# Patient Record
Sex: Female | Born: 1985
Health system: Southern US, Community
[De-identification: ages and names within clinical notes are randomized; demographics above are authoritative.]

## PROBLEM LIST (undated history)

## (undated) DIAGNOSIS — F329 Major depressive disorder, single episode, unspecified: Secondary | ICD-10-CM

## (undated) DIAGNOSIS — F419 Anxiety disorder, unspecified: Secondary | ICD-10-CM

## (undated) DIAGNOSIS — E669 Obesity, unspecified: Secondary | ICD-10-CM

## (undated) DIAGNOSIS — K589 Irritable bowel syndrome without diarrhea: Secondary | ICD-10-CM

## (undated) DIAGNOSIS — K219 Gastro-esophageal reflux disease without esophagitis: Secondary | ICD-10-CM

## (undated) DIAGNOSIS — I10 Essential (primary) hypertension: Secondary | ICD-10-CM

## (undated) DIAGNOSIS — T7840XA Allergy, unspecified, initial encounter: Secondary | ICD-10-CM

## (undated) DIAGNOSIS — K200 Eosinophilic esophagitis: Secondary | ICD-10-CM

## (undated) DIAGNOSIS — F32A Depression, unspecified: Secondary | ICD-10-CM

## (undated) HISTORY — PX: CHOLECYSTECTOMY: SHX55

## (undated) HISTORY — DX: Essential (primary) hypertension: I10

## (undated) HISTORY — DX: Depression, unspecified: F32.A

## (undated) HISTORY — DX: Gastro-esophageal reflux disease without esophagitis: K21.9

## (undated) HISTORY — DX: Obesity, unspecified: E66.9

## (undated) HISTORY — DX: Irritable bowel syndrome, unspecified: K58.9

## (undated) HISTORY — PX: TONSILLECTOMY: SUR1361

## (undated) HISTORY — PX: ESOPHAGEAL DILATION: SHX303

## (undated) HISTORY — DX: Anxiety disorder, unspecified: F41.9

## (undated) HISTORY — DX: Allergy, unspecified, initial encounter: T78.40XA

## (undated) HISTORY — DX: Major depressive disorder, single episode, unspecified: F32.9

## (undated) HISTORY — DX: Eosinophilic esophagitis: K20.0

---

## 2017-09-03 DIAGNOSIS — L03011 Cellulitis of right finger: Secondary | ICD-10-CM | POA: Diagnosis not present

## 2018-02-01 ENCOUNTER — Encounter: Payer: Self-pay | Admitting: Physician Assistant

## 2018-02-01 ENCOUNTER — Ambulatory Visit (INDEPENDENT_AMBULATORY_CARE_PROVIDER_SITE_OTHER): Payer: BLUE CROSS/BLUE SHIELD | Admitting: Physician Assistant

## 2018-02-01 VITALS — BP 164/111 | HR 93 | Resp 14 | Ht 64.0 in | Wt 390.0 lb

## 2018-02-01 DIAGNOSIS — F332 Major depressive disorder, recurrent severe without psychotic features: Secondary | ICD-10-CM | POA: Diagnosis not present

## 2018-02-01 DIAGNOSIS — M545 Low back pain, unspecified: Secondary | ICD-10-CM | POA: Insufficient documentation

## 2018-02-01 DIAGNOSIS — K200 Eosinophilic esophagitis: Secondary | ICD-10-CM | POA: Insufficient documentation

## 2018-02-01 DIAGNOSIS — R0609 Other forms of dyspnea: Secondary | ICD-10-CM | POA: Diagnosis not present

## 2018-02-01 DIAGNOSIS — E1165 Type 2 diabetes mellitus with hyperglycemia: Secondary | ICD-10-CM

## 2018-02-01 DIAGNOSIS — K589 Irritable bowel syndrome without diarrhea: Secondary | ICD-10-CM | POA: Insufficient documentation

## 2018-02-01 DIAGNOSIS — Z13220 Encounter for screening for lipoid disorders: Secondary | ICD-10-CM

## 2018-02-01 DIAGNOSIS — R0600 Dyspnea, unspecified: Secondary | ICD-10-CM

## 2018-02-01 DIAGNOSIS — Z131 Encounter for screening for diabetes mellitus: Secondary | ICD-10-CM

## 2018-02-01 DIAGNOSIS — Z6841 Body Mass Index (BMI) 40.0 and over, adult: Secondary | ICD-10-CM

## 2018-02-01 DIAGNOSIS — I10 Essential (primary) hypertension: Secondary | ICD-10-CM | POA: Insufficient documentation

## 2018-02-01 DIAGNOSIS — Z7689 Persons encountering health services in other specified circumstances: Secondary | ICD-10-CM

## 2018-02-01 DIAGNOSIS — Z1329 Encounter for screening for other suspected endocrine disorder: Secondary | ICD-10-CM

## 2018-02-01 DIAGNOSIS — F411 Generalized anxiety disorder: Secondary | ICD-10-CM

## 2018-02-01 DIAGNOSIS — R002 Palpitations: Secondary | ICD-10-CM | POA: Insufficient documentation

## 2018-02-01 DIAGNOSIS — Z130 Encounter for screening for diseases of the blood and blood-forming organs and certain disorders involving the immune mechanism: Secondary | ICD-10-CM

## 2018-02-01 MED ORDER — BUPROPION HCL ER (XL) 150 MG PO TB24: 150.0000 mg | ORAL_TABLET | ORAL | 0 refills | Status: DC

## 2018-02-01 MED ORDER — NAPROXEN 375 MG PO TABS: 375.0000 mg | ORAL_TABLET | Freq: Two times a day (BID) | ORAL | 0 refills | Status: DC | PRN

## 2018-02-01 MED ORDER — VALSARTAN-HYDROCHLOROTHIAZIDE 160-25 MG PO TABS: 1.0000 | ORAL_TABLET | Freq: Every day | ORAL | 0 refills | Status: DC

## 2018-02-01 NOTE — Patient Instructions (Signed)
Managing Your Hypertension Hypertension is commonly called high blood pressure. This is when the force of your blood pressing against the walls of your arteries is too strong. Arteries are blood vessels that carry blood from your heart throughout your body. Hypertension forces the heart to work harder to pump blood, and may cause the arteries to become narrow or stiff. Having untreated or uncontrolled hypertension can cause heart attack, stroke, kidney disease, and other problems. What are blood pressure readings? A blood pressure reading consists of a higher number over a lower number. Ideally, your blood pressure should be below 120/80. The first ("top") number is called the systolic pressure. It is a measure of the pressure in your arteries as your heart beats. The second ("bottom") number is called the diastolic pressure. It is a measure of the pressure in your arteries as the heart relaxes. What does my blood pressure reading mean? Blood pressure is classified into four stages. Based on your blood pressure reading, your health care provider may use the following stages to determine what type of treatment you need, if any. Systolic pressure and diastolic pressure are measured in a unit called mm Hg. Normal  Systolic pressure: below 120.  Diastolic pressure: below 80. Elevated  Systolic pressure: 120-129.  Diastolic pressure: below 80. Hypertension stage 1  Systolic pressure: 130-139.  Diastolic pressure: 80-89. Hypertension stage 2  Systolic pressure: 140 or above.  Diastolic pressure: 90 or above. What health risks are associated with hypertension? Managing your hypertension is an important responsibility. Uncontrolled hypertension can lead to:  A heart attack.  A stroke.  A weakened blood vessel (aneurysm).  Heart failure.  Kidney damage.  Eye damage.  Metabolic syndrome.  Memory and concentration problems.  What changes can I make to manage my  hypertension? Hypertension can be managed by making lifestyle changes and possibly by taking medicines. Your health care provider will help you make a plan to bring your blood pressure within a normal range. Eating and drinking  Eat a diet that is high in fiber and potassium, and low in salt (sodium), added sugar, and fat. An example eating plan is called the DASH (Dietary Approaches to Stop Hypertension) diet. To eat this way: ? Eat plenty of fresh fruits and vegetables. Try to fill half of your plate at each meal with fruits and vegetables. ? Eat whole grains, such as whole wheat pasta, brown rice, or whole grain bread. Fill about one quarter of your plate with whole grains. ? Eat low-fat diary products. ? Avoid fatty cuts of meat, processed or cured meats, and poultry with skin. Fill about one quarter of your plate with lean proteins such as fish, chicken without skin, beans, eggs, and tofu. ? Avoid premade and processed foods. These tend to be higher in sodium, added sugar, and fat.  Reduce your daily sodium intake. Most people with hypertension should eat less than 1,500 mg of sodium a day.  Limit alcohol intake to no more than 1 drink a day for nonpregnant women and 2 drinks a day for men. One drink equals 12 oz of beer, 5 oz of wine, or 1 oz of hard liquor. Lifestyle  Work with your health care provider to maintain a healthy body weight, or to lose weight. Ask what an ideal weight is for you.  Get at least 30 minutes of exercise that causes your heart to beat faster (aerobic exercise) most days of the week. Activities may include walking, swimming, or biking.  Include exercise   to strengthen your muscles (resistance exercise), such as weight lifting, as part of your weekly exercise routine. Try to do these types of exercises for 30 minutes at least 3 days a week.  Do not use any products that contain nicotine or tobacco, such as cigarettes and e-cigarettes. If you need help quitting, ask  your health care provider.  Control any long-term (chronic) conditions you have, such as high cholesterol or diabetes. Monitoring  Monitor your blood pressure at home as told by your health care provider. Your personal target blood pressure may vary depending on your medical conditions, your age, and other factors.  Have your blood pressure checked regularly, as often as told by your health care provider. Working with your health care provider  Review all the medicines you take with your health care provider because there may be side effects or interactions.  Talk with your health care provider about your diet, exercise habits, and other lifestyle factors that may be contributing to hypertension.  Visit your health care provider regularly. Your health care provider can help you create and adjust your plan for managing hypertension. Will I need medicine to control my blood pressure? Your health care provider may prescribe medicine if lifestyle changes are not enough to get your blood pressure under control, and if:  Your systolic blood pressure is 130 or higher.  Your diastolic blood pressure is 80 or higher.  Take medicines only as told by your health care provider. Follow the directions carefully. Blood pressure medicines must be taken as prescribed. The medicine does not work as well when you skip doses. Skipping doses also puts you at risk for problems. Contact a health care provider if:  You think you are having a reaction to medicines you have taken.  You have repeated (recurrent) headaches.  You feel dizzy.  You have swelling in your ankles.  You have trouble with your vision. Get help right away if:  You develop a severe headache or confusion.  You have unusual weakness or numbness, or you feel faint.  You have severe pain in your chest or abdomen.  You vomit repeatedly.  You have trouble breathing. Summary  Hypertension is when the force of blood pumping through  your arteries is too strong. If this condition is not controlled, it may put you at risk for serious complications.  Your personal target blood pressure may vary depending on your medical conditions, your age, and other factors. For most people, a normal blood pressure is less than 120/80.  Hypertension is managed by lifestyle changes, medicines, or both. Lifestyle changes include weight loss, eating a healthy, low-sodium diet, exercising more, and limiting alcohol. This information is not intended to replace advice given to you by your health care provider. Make sure you discuss any questions you have with your health care provider. Document Released: 12/23/2011 Document Revised: 02/26/2016 Document Reviewed: 02/26/2016 Elsevier Interactive Patient Education  2018 [employer]  

## 2018-02-01 NOTE — Progress Notes (Signed)
HPI:                                                                Karen Stokes is a 32 y.o. female who presents to [name] today to establish care  Current concerns: anxiety/depression  Reports longstanding history of anxiety and depression. Reports she is very depressed, emotional, tearful, irritable and has gained a lot of weight. Symptoms are gradually worsening and affecting her work. Denies symptoms of mania/hypomania. Denies suicidal thinking. Denies auditory/visual hallucinations. Prior medications: Wellbutrin (was effective), Triliptal (hallucinations)  She also has had increased low back pain, bilateral, moderate, persistent.  Denies saddle numbness, bowel or bladder dysfunction, or radicular symptoms.  Depression screen PHQ 2/9 02/01/2018  Decreased Interest 3  Down, Depressed, Hopeless 3  PHQ - 2 Score 6  Altered sleeping 3  Tired, decreased energy 3  Change in appetite 3  Feeling bad or failure about yourself  3  Trouble concentrating 3  Moving slowly or fidgety/restless 3  Suicidal thoughts 0  PHQ-9 Score 24  Difficult doing work/chores Extremely dIfficult    GAD 7 : Generalized Anxiety Score 02/01/2018  Nervous, Anxious, on Edge 3  Control/stop worrying 3  Worry too much - different things 3  Trouble relaxing 3  Restless 3  Easily annoyed or irritable 3  Afraid - awful might happen 3  Total GAD 7 Score 21  Anxiety Difficulty Extremely difficult      Past Medical History:  Diagnosis Date  . Allergy   . Anxiety   . Depression   . Eosinophilic esophagitis   . GERD (gastroesophageal reflux disease)   . Hypertension   . Irritable bowel syndrome   . Obesity    Past Surgical History:  Procedure Laterality Date  . CHOLECYSTECTOMY    . ESOPHAGEAL DILATION    . TONSILLECTOMY     Social History   Tobacco Use  . Smoking status: Never Smoker  . Smokeless tobacco: Never Used  Substance Use Topics   . Alcohol use: Never    Frequency: Never   family history includes Heart attack in her maternal grandfather; Hypertension in her maternal grandfather; Stroke in her maternal grandfather.    ROS: Review of Systems  HENT: Positive for sinus pain (pressure).   Respiratory: Positive for shortness of breath (w/exertion).   Cardiovascular: Positive for chest pain, palpitations and leg swelling.  Gastrointestinal: Positive for abdominal pain, diarrhea and heartburn.  Musculoskeletal: Positive for back pain.  Skin: Positive for itching.  Neurological: Positive for headaches.  Endo/Heme/Allergies: Positive for environmental allergies.  Psychiatric/Behavioral: Positive for depression. The patient is nervous/anxious.       Medications: Current Outpatient Medications  Medication Sig Dispense Refill  . omeprazole (PRILOSEC) 20 MG capsule Take 1 capsule by mouth daily.    Marland Kitchen buPROPion (WELLBUTRIN XL) 150 MG 24 hr tablet Take 1 tablet (150 mg total) by mouth every morning. 30 tablet 0  . metFORMIN (GLUCOPHAGE) 500 MG tablet Take 1 tablet (500 mg total) by mouth 2 (two) times daily with a meal. 60 tablet 2  . naproxen (NAPROSYN) 375 MG tablet Take 1 tablet (375 mg total) by mouth 2 (two) times daily as needed (back pain). 14 tablet 0  . valsartan-hydrochlorothiazide (DIOVAN-HCT)  160-25 MG tablet Take 1 tablet by mouth daily. 15 tablet 0   No current facility-administered medications for this visit.    Allergies  Allergen Reactions  . Amoxicillin-Pot Clavulanate Nausea And Vomiting  . Morphine And Related Other (See Comments)    headaches headaches   . Trileptal [Oxcarbazepine] Other (See Comments)    hallucinations       Objective:  BP (!) 164/111   Pulse 93   Resp 14   Ht 5\' 4"  (1.626 m)   Wt (!) 390 lb (176.9 kg)   LMP 01/19/1988   SpO2 99%   BMI 66.94 kg/m  Gen:  alert, not ill-appearing, no distress, appropriate for age, obese female HEENT: head normocephalic without  obvious abnormality, conjunctiva and cornea clear, trachea midline Pulm: Normal work of breathing, normal phonation, clear to auscultation bilaterally, no wheezes, rales or rhonchi CV: Normal rate, regular rhythm, s1 and s2 distinct, no murmurs, clicks or rubs  Neuro: alert and oriented x 3, no tremor MSK: extremities atraumatic, normal gait and station Skin: intact, no rashes on exposed skin, no jaundice, no cyanosis Psych: appearance casual, cooperative, good eye contact, depressed mood, affect mood-congruent, speech is articulate, and thought processes clear and goal-directed    No results found for this or any previous visit (from the past 72 hour(s)). No results found.    Assessment and Plan: 32 y.o. female with   .Copper was seen today for establish care.  Diagnoses and all orders for this visit:  Encounter to establish care  Eosinophilic esophagitis  Dyspnea on exertion  GAD (generalized anxiety disorder)  Severe episode of recurrent major depressive disorder, without psychotic features (HCC) -     buPROPion (WELLBUTRIN XL) 150 MG 24 hr tablet; Take 1 tablet (150 mg total) by mouth every morning.  Screening for thyroid disorder -     TSH + free T4  Screening for diabetes mellitus -     Hemoglobin A1c  Screening for blood disease -     CBC -     Comprehensive metabolic panel  Screening for lipid disorders -     Lipid Panel w/reflex Direct LDL  Palpitations -     TSH + free T4  Class 3 severe obesity due to excess calories with body mass index (BMI) of 60.0 to 69.9 in adult, unspecified whether serious comorbidity present (HCC) -     Hemoglobin A1c  Uncontrolled stage 2 hypertension -     valsartan-hydrochlorothiazide (DIOVAN-HCT) 160-25 MG tablet; Take 1 tablet by mouth daily.  Bilateral low back pain without sciatica, unspecified chronicity -     naproxen (NAPROSYN) 375 MG tablet; Take 1 tablet (375 mg total) by mouth 2 (two) times daily as needed (back  pain).  Irritable bowel syndrome with diarrhea  Uncontrolled type 2 diabetes mellitus with hyperglycemia (HCC) -     metFORMIN (GLUCOPHAGE) 500 MG tablet; Take 1 tablet (500 mg total) by mouth 2 (two) times daily with a meal.     - Personally reviewed PMH, PSH, PFH, medications, allergies, HM - Age-appropriate cancer screening: overdue for Pap smear, last Pap 09/28/12 NILM - Influenza and Tdap declined - PHQ2 positive  Hypertension: Severe, class II, asymptomatic today.  Risk factors include obesity, hyperlipidemia, family history.  Starting dual therapy with ARB-thiazide.  Patient counseled on therapeutic lifestyle changes.  Follow-up in 2 weeks.  Major depression: No acute safety issues.  We will start Wellbutrin XL 150 mg daily since this has been effective for her in  the past.  Follow-up in 1 month.  Low back pain: No red flag symptoms.  Naprosyn twice daily as needed for pain.  Home rehab exercises.  She understands weight loss will also help with this.  Patient has had tubal medical concerns this visit and we will address weight in greater detail at a future visit.  Patient education and anticipatory guidance given Patient agrees with treatment plan Follow-up in 2 weeks or sooner as needed if symptoms worsen or fail to improve  Levonne Hubert PA-C

## 2018-02-03 DIAGNOSIS — Z6841 Body Mass Index (BMI) 40.0 and over, adult: Secondary | ICD-10-CM | POA: Diagnosis not present

## 2018-02-03 DIAGNOSIS — Z1329 Encounter for screening for other suspected endocrine disorder: Secondary | ICD-10-CM | POA: Diagnosis not present

## 2018-02-03 DIAGNOSIS — R002 Palpitations: Secondary | ICD-10-CM | POA: Diagnosis not present

## 2018-02-03 DIAGNOSIS — Z13220 Encounter for screening for lipoid disorders: Secondary | ICD-10-CM | POA: Diagnosis not present

## 2018-02-04 LAB — COMPREHENSIVE METABOLIC PANEL
AG Ratio: 1.6 (calc) (ref 1.0–2.5)
ALT: 23 U/L (ref 6–29)
AST: 13 U/L (ref 10–30)
Albumin: 3.9 g/dL (ref 3.6–5.1)
Alkaline phosphatase (APISO): 109 U/L (ref 33–115)
BUN: 12 mg/dL (ref 7–25)
CO2: 25 mmol/L (ref 20–32)
Calcium: 8.6 mg/dL (ref 8.6–10.2)
Chloride: 103 mmol/L (ref 98–110)
Creat: 0.79 mg/dL (ref 0.50–1.10)
Globulin: 2.5 g/dL (calc) (ref 1.9–3.7)
Glucose, Bld: 201 mg/dL — ABNORMAL HIGH (ref 65–99)
Potassium: 4.1 mmol/L (ref 3.5–5.3)
Sodium: 136 mmol/L (ref 135–146)
Total Bilirubin: 0.5 mg/dL (ref 0.2–1.2)
Total Protein: 6.4 g/dL (ref 6.1–8.1)

## 2018-02-04 LAB — CBC
HCT: 38.2 % (ref 35.0–45.0)
Hemoglobin: 12.8 g/dL (ref 11.7–15.5)
MCH: 28.6 pg (ref 27.0–33.0)
MCHC: 33.5 g/dL (ref 32.0–36.0)
MCV: 85.5 fL (ref 80.0–100.0)
MPV: 11.4 fL (ref 7.5–12.5)
Platelets: 242 10*3/uL (ref 140–400)
RBC: 4.47 10*6/uL (ref 3.80–5.10)
RDW: 12.7 % (ref 11.0–15.0)
WBC: 9.2 10*3/uL (ref 3.8–10.8)

## 2018-02-04 LAB — HEMOGLOBIN A1C
Hgb A1c MFr Bld: 8.4 % of total Hgb — ABNORMAL HIGH (ref <5.7)
Mean Plasma Glucose: 194 (calc)
eAG (mmol/L): 10.8 (calc)

## 2018-02-04 LAB — TSH+FREE T4: TSH W/REFLEX TO FT4: 2.42 mIU/L

## 2018-02-04 LAB — LIPID PANEL W/REFLEX DIRECT LDL
Cholesterol: 219 mg/dL — ABNORMAL HIGH (ref <200)
HDL: 37 mg/dL — ABNORMAL LOW (ref >50)
LDL Cholesterol (Calc): 143 mg/dL (calc) — ABNORMAL HIGH
Non-HDL Cholesterol (Calc): 182 mg/dL (calc) — ABNORMAL HIGH (ref <130)
Total CHOL/HDL Ratio: 5.9 (calc) — ABNORMAL HIGH (ref <5.0)
Triglycerides: 253 mg/dL — ABNORMAL HIGH (ref <150)

## 2018-02-04 MED ORDER — METFORMIN HCL 500 MG PO TABS: 500.0000 mg | ORAL_TABLET | Freq: Two times a day (BID) | ORAL | 2 refills | Status: DC

## 2018-02-13 ENCOUNTER — Encounter: Payer: Self-pay | Admitting: Physician Assistant

## 2018-02-13 DIAGNOSIS — E1165 Type 2 diabetes mellitus with hyperglycemia: Secondary | ICD-10-CM | POA: Insufficient documentation

## 2018-02-14 ENCOUNTER — Other Ambulatory Visit: Payer: Self-pay | Admitting: Physician Assistant

## 2018-02-14 ENCOUNTER — Other Ambulatory Visit (HOSPITAL_COMMUNITY)
Admission: RE | Admit: 2018-02-14 | Discharge: 2018-02-14 | Disposition: A | Discharge status: Home / Self Care | Payer: BLUE CROSS/BLUE SHIELD | Source: Ambulatory Visit | Attending: Osteopathic Medicine | Admitting: Osteopathic Medicine

## 2018-02-14 ENCOUNTER — Ambulatory Visit (INDEPENDENT_AMBULATORY_CARE_PROVIDER_SITE_OTHER): Payer: BLUE CROSS/BLUE SHIELD | Admitting: Osteopathic Medicine

## 2018-02-14 ENCOUNTER — Encounter: Payer: Self-pay | Admitting: Osteopathic Medicine

## 2018-02-14 VITALS — BP 115/77 | HR 106 | Temp 98.3°F | Wt 381.6 lb

## 2018-02-14 DIAGNOSIS — Z124 Encounter for screening for malignant neoplasm of cervix: Secondary | ICD-10-CM | POA: Diagnosis not present

## 2018-02-14 DIAGNOSIS — Z30430 Encounter for insertion of intrauterine contraceptive device: Secondary | ICD-10-CM

## 2018-02-14 DIAGNOSIS — I10 Essential (primary) hypertension: Secondary | ICD-10-CM

## 2018-02-14 LAB — POCT URINE PREGNANCY: Preg Test, Ur: NEGATIVE

## 2018-02-14 NOTE — Progress Notes (Signed)
error 

## 2018-02-14 NOTE — Patient Instructions (Signed)
IUD AFTER-CARE INSTRUCTIONS: READ THOROUGHLY  Your Mirena IUD is currently approved to remain in place for 5 years. At that time, if you wish to receive a new IUD, this can be placed when your current one is removed. If you wish to remove your IUD at any time, for any reason, this can be done easily by your doctor.   You should feel for the strings to your IUD routinely. If you cannot locate the strings, it is recommended you alert your doctor of this.   Be aware that in the first few weeks, your new IUD may cause some discomfort/cramping as it settles into place in your uterus. To ease discomfort, you may apply heating pad to abdomen and take Ibuprofen 800mg by mouth every 6 hours as needed, but avoid using this dose continuously for more than 5 days. Walking helps as well. You can also expect some irregular bleeding but it should not be heavy for more than a few days.   If pain is severe or if severe bleeding occurs, or if foul-smelling discharge or fever develops, or if you have any other concerns - contact your doctor right away or go to the Emergency Room.  IUD's are a very reliable method of birth control, but no method is 100% effective. If you think you may be pregnant, see your doctor right away. An IUD will not protect you from sexually transmitted infections such as HIV, gonorrhea, chlamydia, HPV and others.   It is recommended that you see your doctor as directed for routine well-woman care, which includes Pap testing and may include screening for infections.   

## 2018-02-14 NOTE — Progress Notes (Signed)
HPI: Karen Stokes is a 32 y.o. female who presents to [name] today 02/14/18 for chief complaint of: No chief complaint on file.   Patient is seen at the request of Carlis Stable, PA-C for consultation regarding IUD birth control. She requests Mirena removal and replacement, she's happy with this method.     Past medical, social and family history reviewed: Patient Active Problem List   Diagnosis Date Noted  . Uncontrolled type 2 diabetes mellitus with hyperglycemia (HCC) 02/13/2018  . Palpitations 02/01/2018  . Class 3 severe obesity due to excess calories with body mass index (BMI) of 60.0 to 69.9 in adult (HCC) 02/01/2018  . Uncontrolled stage 2 hypertension 02/01/2018  . Bilateral low back pain without sciatica 02/01/2018  . Dyspnea on exertion 02/01/2018  . GAD (generalized anxiety disorder) 02/01/2018  . Severe episode of recurrent major depressive disorder, without psychotic features (HCC) 02/01/2018  . Eosinophilic esophagitis   . Irritable bowel syndrome    Past Surgical History:  Procedure Laterality Date  . CHOLECYSTECTOMY    . ESOPHAGEAL DILATION    . TONSILLECTOMY      Family History  Problem Relation Age of Onset  . Hypertension Maternal Grandfather   . Heart attack Maternal Grandfather   . Stroke Maternal Grandfather    Current Meds  Medication Sig  . buPROPion (WELLBUTRIN XL) 150 MG 24 hr tablet Take 1 tablet (150 mg total) by mouth every morning.  . metFORMIN (GLUCOPHAGE) 500 MG tablet Take 1 tablet (500 mg total) by mouth 2 (two) times daily with a meal.  . naproxen (NAPROSYN) 375 MG tablet Take 1 tablet (375 mg total) by mouth 2 (two) times daily as needed (back pain).  Marland Kitchen omeprazole (PRILOSEC) 20 MG capsule Take 1 capsule by mouth daily.  . valsartan-hydrochlorothiazide (DIOVAN-HCT) 160-25 MG tablet Take 1 tablet by mouth daily.    Allergies  Allergen Reactions  . Amoxicillin-Pot Clavulanate  Nausea And Vomiting  . Morphine And Related Other (See Comments)    headaches   . Trileptal [Oxcarbazepine] Other (See Comments)    hallucinations      Review of Systems: CONSTITUTIONAL:  No  fever, no chills HEAD/EYES/EARS/NOSE/THROAT: No  headache, no vision change, no history migraines CARDIAC: No  chest pain, No  pressure, No palpitations RESPIRATORY: No  cough, No  shortness of breath/wheeze GASTROINTESTINAL: No  nausea, No  vomiting, No  abdominal pain GENITOURINARY: No  incontinence, No  abnormal genital bleeding/discharge SKIN: No  rash/wounds/concerning lesions   Exam:  BP 115/77 (BP Location: Left Arm, Patient Position: Sitting, Cuff Size: Large)   Pulse (!) 106   Temp 98.3 F (36.8 C) (Oral)   Wt (!) 381 lb 9.6 oz (173.1 kg)   BMI 65.50 kg/m   Constitutional: VS see above. General Appearance: alert, well-developed, well-nourished, NAD Psychiatric: Normal judgment/insight. Normal mood and affect. Oriented x3.  GYN: see procedure note below    Results for orders placed or performed in visit on 02/14/18 (from the past 24 hour(s))  POCT urine pregnancy     Status: None   Collection Time: 02/14/18 11:10 AM  Result Value Ref Range   Preg Test, Ur Negative Negative   IUD PROCEDURE NOTE  PERTINENT RESULTS REVIEWED: PREGNANCY TEST PRIOR TO PROCEDURE: Negative GONORRHEA/CHLAMYDIA SCREEN: Not Available  PRIOR TO PROCEDURE: INFORMED CONSENT OBTAINED: yes SEE SCANNED DOCUMENTS ANY PRETREATMENT: no  PHYSICAL EXAM: GYN: No lesions/ulcers to external genitalia, normal urethra, normal vaginal mucosa, physiologic discharge, cervix normal  without lesions, uterus not enlarged or tender, adnexa no masses and nontender  DESCRIPTION OF PROCEDURE: Vaginal speculum placed.  IUD strings were visible, were grasped with ring forceps however came loose from the IUD device itself/strings broke.  Some strings were still visible right at the cervical office and were grasped with long  forceps and IUD device and remainder of string was removed successfully.  Pap smear obtained.  Cervix and proximal vagina cleaned with Betadine.Tenaculum applied at 12:00 cervical position and gentle traction applied. Uterus sounded to 6. IUD placed without difficulty initially by Gena Fray, however device came out, new Mirena was obtained, I double checked uterine measurements and got 6 cm, new IUD was placed without difficulty. IUD threads cut to 2-3cm from cervical os. Tenaculum and speculum removed. Patient felt strings. Patient tolerated procedure well. Sterile technique maintained.   IUD INFORMATION: BRAND: Mirena  CARD GIVEN TO PATIENT: yes   ASSESSMENT/PLAN:   Patient was counseled on the risks versus benefits of IUD contraception, including excellent contraceptive efficacy but risk of uterine perforation, small chance of ascending infection, irregular bleeding, and others. In light of her preferences, previous contraception experience, other risk factors as noted in history of present illness. Patient opts to proceed with insertion of Mirena IUD. Patient was educated on the process for insertion, what to expect from vaginal exam and insertion process, reasons that we would abort the procedure such as abnormal anatomy, non-passage of the uterine sound, patient inability to tolerate the procedure, or other.  See procedure note above. Minor complications but IUD was removed/repleaced successfully and patient was stable for discharge   Encounter for IUD insertion - Plan: POCT urine pregnancy  Cervical cancer screening - Plan: Cytology - PAP   Patient Instructions  IUD AFTER-CARE INSTRUCTIONS: READ THOROUGHLY  Your Mirena IUD is currently approved to remain in place for 5 years. At that time, if you wish to receive a new IUD, this can be placed when your current one is removed. If you wish to remove your IUD at any time, for any reason, this can be done easily by your doctor.   You  should feel for the strings to your IUD routinely. If you cannot locate the strings, it is recommended you alert your doctor of this.   Be aware that in the first few weeks, your new IUD may cause some discomfort/cramping as it settles into place in your uterus. To ease discomfort, you may apply heating pad to abdomen and take Ibuprofen 800mg  by mouth every 6 hours as needed, but avoid using this dose continuously for more than 5 days. Walking helps as well. You can also expect some irregular bleeding but it should not be heavy for more than a few days.   If pain is severe or if severe bleeding occurs, or if foul-smelling discharge or fever develops, or if you have any other concerns - contact your doctor right away or go to the Emergency Room.  IUD's are a very reliable method of birth control, but no method is 100% effective. If you think you may be pregnant, see your doctor right away.   An IUD will not protect you from sexually transmitted infections such as HIV, gonorrhea, chlamydia, HPV and others.   It is recommended that you see your doctor as directed for routine well-woman care, which includes Pap testing and may include screening for infections.     Return in about 4 weeks (around 03/14/2018) for IUD string check w/ Dr Lyn Hollingshead or  Charley .   Total time spent 25 minutes, greater than 50% of the visit was counseling and coordinating care for diagnosis of The primary encounter diagnosis was Encounter for IUD insertion. A diagnosis of Cervical cancer screening was also pertinent to this visit.

## 2018-02-16 LAB — CYTOLOGY - PAP
Diagnosis: NEGATIVE
HPV: NOT DETECTED

## 2018-02-27 ENCOUNTER — Other Ambulatory Visit: Payer: Self-pay | Admitting: Physician Assistant

## 2018-02-27 DIAGNOSIS — F332 Major depressive disorder, recurrent severe without psychotic features: Secondary | ICD-10-CM

## 2018-03-02 ENCOUNTER — Other Ambulatory Visit: Payer: Self-pay | Admitting: Physician Assistant

## 2018-03-02 DIAGNOSIS — E1165 Type 2 diabetes mellitus with hyperglycemia: Secondary | ICD-10-CM

## 2018-03-14 ENCOUNTER — Encounter: Payer: Self-pay | Admitting: Physician Assistant

## 2018-03-14 ENCOUNTER — Ambulatory Visit (INDEPENDENT_AMBULATORY_CARE_PROVIDER_SITE_OTHER): Payer: BLUE CROSS/BLUE SHIELD | Admitting: Physician Assistant

## 2018-03-14 VITALS — BP 136/87 | HR 103 | Wt 378.0 lb

## 2018-03-14 DIAGNOSIS — G43019 Migraine without aura, intractable, without status migrainosus: Secondary | ICD-10-CM | POA: Diagnosis not present

## 2018-03-14 DIAGNOSIS — Z30431 Encounter for routine checking of intrauterine contraceptive device: Secondary | ICD-10-CM | POA: Diagnosis not present

## 2018-03-14 MED ORDER — DEXAMETHASONE 4 MG PO TABS: ORAL_TABLET | ORAL | 0 refills | Status: DC

## 2018-03-14 MED ORDER — SUMATRIPTAN SUCCINATE 50 MG PO TABS: 50.0000 mg | ORAL_TABLET | Freq: Once | ORAL | 2 refills | Status: DC | PRN

## 2018-03-14 NOTE — Progress Notes (Signed)
HPI:                                                                Karen Stokes is a 32 y.o. female who presents to [name]: [facility] today for IUD string check  Mirena IUD inserted by my colleague Dr. Lyn Hollingshead on 02/14/2018. Denies pelvic pain, vaginal bleeding or dyspareunia.   Migraine   This is a recurrent problem. The current episode started in the past 7 days. The problem occurs daily. The problem has been unchanged. The pain is located in the left unilateral region. The pain does not radiate. The pain quality is similar to prior headaches. The quality of the pain is described as throbbing. The pain is moderate. Associated symptoms include phonophobia, photophobia and tingling (hands). Pertinent negatives include no facial sweating, fever, nausea, neck pain, visual change, vomiting or weakness. The symptoms are aggravated by activity (+ position changes). She has tried NSAIDs for the symptoms. The treatment provided mild relief. Her past medical history is significant for hypertension and migraines in the family.  Reports she has had episodic headaches for approximately the last 11 years ever since the birth of her daughter.   Past Medical History:  Diagnosis Date  . Allergy   . Anxiety   . Depression   . Eosinophilic esophagitis   . GERD (gastroesophageal reflux disease)   . Hypertension   . Irritable bowel syndrome   . Obesity    Past Surgical History:  Procedure Laterality Date  . CHOLECYSTECTOMY    . ESOPHAGEAL DILATION    . TONSILLECTOMY     Social History   Tobacco Use  . Smoking status: Never Smoker  . Smokeless tobacco: Never Used  Substance Use Topics  . Alcohol use: Never    Frequency: Never   family history includes Heart attack in her maternal grandfather; Hypertension in her maternal grandfather; Stroke in her maternal grandfather.    ROS: negative except as noted in the HPI  Medications: Current  Outpatient Medications  Medication Sig Dispense Refill  . buPROPion (WELLBUTRIN XL) 150 MG 24 hr tablet TAKE 1 TABLET BY MOUTH EVERY DAY IN THE MORNING 30 tablet 0  . metFORMIN (GLUCOPHAGE) 500 MG tablet TAKE 1 TABLET TWICE A DAY WITH FOOD 60 tablet 1  . naproxen (NAPROSYN) 375 MG tablet Take 1 tablet (375 mg total) by mouth 2 (two) times daily as needed (back pain). 14 tablet 0  . omeprazole (PRILOSEC) 20 MG capsule Take 1 capsule by mouth daily.    . valsartan-hydrochlorothiazide (DIOVAN-HCT) 160-25 MG tablet TAKE 1 TABLET BY MOUTH EVERY DAY 90 tablet 0   No current facility-administered medications for this visit.    Allergies  Allergen Reactions  . Amoxicillin-Pot Clavulanate Nausea And Vomiting  . Morphine And Related Other (See Comments)    headaches   . Trileptal [Oxcarbazepine] Other (See Comments)    hallucinations       Objective:  BP 136/87   Pulse (!) 103   Wt (!) 378 lb (171.5 kg)   BMI 64.88 kg/m  Gen: well-groomed, not ill-appearing, no acute distress, obese female HEENT: head normocephalic, atraumatic; conjunctiva and cornea clear,  Pulm: Normal work of breathing, normal phonation, clear to auscultation bilaterally CV: mildly tachycardic  rate, regular rhythm, s1 and s2 distinct, no murmurs, clicks or rubs GU: vulva without rashes or lesions, normal introitus and urethral meatus, vaginal mucosa without erythema, normal discharge, cervix without lesions, IUD strings visualized  Neuro:  cranial nerves II-XII intact, normal rapid alternating movements, normal tone, no tremor MSK: strength 5/5 and symmetric in bilateral upper and lower extremities, normal gait and station Mental Status: alert and oriented x 3, speech articulate, and thought processes clear and goal-directed   A chaperone was used for the GU portion of the exam, Ardell IsaacsEvoni Henry, RMA.    No results found for this or any previous visit (from the past 72 hour(s)). No results found.    Assessment and  Plan: 32 y.o. female with   .Karen Stokes was seen today for follow-up.  Diagnoses and all orders for this visit:  Encounter for routine checking of intrauterine contraceptive device (IUD)  Intractable migraine without aura and without status migrainosus -     SUMAtriptan (IMITREX) 50 MG tablet; Take 1-2 tablets (50-100 mg total) by mouth once as needed for up to 1 dose for migraine. Repeat in 2 hours if needed -     dexamethasone (DECADRON) 4 MG tablet; 2 tabs (8 mg) PO once followed by 4 mg every 12 hours for 5 days  IUD strings visualized on exam today. No complications since insertion.  Recurrent episodic headaches for several years associated with photophobia, phonophobia. Current headache persisting for 4 days consistent with a migraine. No change from prior headaches. Of note, she was also recently diagnosed with Type 2 diabetes and has not been taking Metformin. Hyperglycemia is likely contributory. She declined migraine cocktail in the office today, but she was amenable to oral decadron and sumatriptan.  Counseled on headache general measures Headache diary   Patient education and anticipatory guidance given Patient agrees with treatment plan Follow-up in 6 weeks for diabetes / headaches or sooner as needed if symptoms worsen or fail to improve  Levonne Hubertharley E. Anahita Cua PA-C

## 2018-03-14 NOTE — Patient Instructions (Addendum)
For your migraines - headache diary (paper or app like MigraineBuddy, iHeadache) - optional B2 (riboflavin) and Magnesium for prophylaxis - take Imitrex at the first sign of migraine for abortive therapy - may repeat once after 2 hours if headache persists - do not use more than twice in 24 hours. And try not to use abortive medication more than 6 times per month - work on sleep hygiene - drink 11 cups of water per day - reduce caffeine intake - avoid alcohol - don't skip meals    Recurrent Migraine Headache Migraines are a type of headache, and they are usually stronger and more sudden than normal headaches (tension headaches). Migraines are characterized by an intense pulsing, throbbing pain that is usually only present on one side of the head. Sometimes, migraine headaches can cause nausea, vomiting, sensitivity to light and sound, and vision changes. Recurrent migraines keep coming back (recurring). A migraine can last from 4 hours up to 3 days. What are the causes? The exact cause of this condition is not known. However, a migraine may be caused when nerves in the brain become irritated and release chemicals that cause inflammation of blood vessels. This inflammation causes pain. Certain things may also trigger migraines, such as:  A disruption in your regular eating and sleeping schedule.  Smoking.  Stress.  Menstruation.  Certain foods and drinks, such as: ? Aged cheese. ? Chocolate. ? Alcohol. ? Caffeine. ? Foods or drinks that contain nitrates, glutamate, aspartame, MSG, or tyramine.  Lack of sleep.  Hunger.  Physical exertion.  Fatigue.  High altitude.  Weather changes.  Medicines, such as: ? Nitroglycerin, which is used to treat chest pain. ? Birth control pills. ? Estrogen. ? Some blood pressure medicines.  What are the signs or symptoms? Symptoms of this condition vary for each person and may include:  Pain that is usually only present on one side  of the head. In some cases, the pain may be on both sides of the head or around the head or neck.  Pulsating or throbbing pain.  Severe pain that prevents daily activities.  Pain that is aggravated by any physical activity.  Nausea, vomiting, or both.  Dizziness.  Pain with exposure to bright lights, loud noises, or activity.  General sensitivity to bright lights, loud noises, or smells.  Before you get a migraine, you may get warning signs that a migraine is coming (aura). An aura may include:  Seeing flashing lights.  Seeing bright spots, halos, or zigzag lines.  Having tunnel vision or blurred vision.  Having numbness or a tingling feeling.  Having trouble talking.  Having muscle weakness.  Smelling a certain odor.  How is this diagnosed? This condition is often diagnosed based on:  Your symptoms and medical history.  A physical exam.  You may also have tests, including:  A CT scan or MRI of your brain. These imaging tests cannot diagnose migraines, but they can help to rule out other causes of headaches.  Blood tests.  How is this treated? This condition is treated with:  Medicines. These are used for: ? Lessening pain and nausea. ? Preventing recurrent migraines.  Lifestyle changes, such as changes to your diet or sleeping patterns.  Behavior therapy, such as relaxation training or biofeedback. Biofeedback is a treatment that involves teaching you to relax and use your brain to lower your heart rate and control your breathing.  Follow these instructions at home: Medicines  Take over-the-counter and prescription medicines only as  told by your health care provider.  Do not drive or use heavy machinery while taking prescription pain medicine. Lifestyle  Do not use any products that contain nicotine or tobacco, such as cigarettes and e-cigarettes. If you need help quitting, ask your health care provider.  Limit alcohol intake to no more than 1 drink  a day for nonpregnant women and 2 drinks a day for men. One drink equals 12 oz of beer, 5 oz of wine, or 1 oz of hard liquor.  Get 7-9 hours of sleep each night, or the amount of sleep recommended by your health care provider.  Limit your stress. Talk with your health care provider if you need help with stress management.  Maintain a healthy weight. If you need help losing weight, ask your health care provider.  Exercise regularly. Aim for 150 minutes of moderate-intensity exercise (walking, biking, yoga) or 75 minutes of vigorous exercise (running, circuit training, swimming) each week. General instructions  Keep a journal to find out what triggers your migraine headaches so you can avoid these triggers. For example, write down: ? What you eat and drink. ? How much sleep you get. ? Any change to your diet or medicines.  Lie down in a dark, quiet room when you have a migraine.  Try placing a cool towel over your head when you have a migraine.  Keep lights dim, if bright lights bother you and make your migraines worse.  Keep all follow-up visits as told by your health care provider. This is important. Contact a health care provider if:  Your pain does not improve, even with medicine.  Your migraines continue to return, even with medicine.  You have a fever.  You have weight loss. Get help right away if:  Your migraine becomes severe and medicine does not help.  You have a stiff neck.  You have a loss of vision.  You have muscle weakness or loss of muscle control.  You start losing your balance or have trouble walking.  You feel faint or you pass out.  You develop new, severe symptoms.  You start having abrupt severe headaches that last for a second or less, like a thunderclap. Summary  Migraine headaches are usually stronger and more sudden than normal headaches (tension headaches). Migraines are characterized by an intense pulsing, throbbing pain that is usually  only present on one side of the head.  The exact cause of this condition is not known. However, a migraine may be caused when nerves in the brain become irritated and release chemicals that cause inflammation of blood vessels.  Certain things may trigger migraines, such as changes to diet or sleeping patterns, smoking, certain foods, alcohol, stress, and certain medicines.  Sometimes, migraine headaches can cause nausea, vomiting, sensitivity to light and sound, and vision changes.  Migraines are often diagnosed based on your symptoms, medical history, and a physical exam. This information is not intended to replace advice given to you by your health care provider. Make sure you discuss any questions you have with your health care provider. Document Released: 12/23/2000 Document Revised: 01/10/2016 Document Reviewed: 01/10/2016 Elsevier Interactive Patient Education  [employer].

## 2018-03-17 ENCOUNTER — Encounter: Payer: Self-pay | Admitting: Physician Assistant

## 2018-03-17 DIAGNOSIS — G43019 Migraine without aura, intractable, without status migrainosus: Secondary | ICD-10-CM | POA: Insufficient documentation

## 2018-03-17 DIAGNOSIS — Z30431 Encounter for routine checking of intrauterine contraceptive device: Secondary | ICD-10-CM | POA: Insufficient documentation

## 2018-03-22 ENCOUNTER — Other Ambulatory Visit: Payer: Self-pay | Admitting: Physician Assistant

## 2018-03-22 DIAGNOSIS — F332 Major depressive disorder, recurrent severe without psychotic features: Secondary | ICD-10-CM

## 2018-04-25 ENCOUNTER — Ambulatory Visit: Payer: BLUE CROSS/BLUE SHIELD | Admitting: Physician Assistant

## 2018-05-09 ENCOUNTER — Encounter: Payer: Self-pay | Admitting: Physician Assistant

## 2018-05-09 ENCOUNTER — Ambulatory Visit (INDEPENDENT_AMBULATORY_CARE_PROVIDER_SITE_OTHER): Payer: Self-pay | Admitting: Physician Assistant

## 2018-05-09 VITALS — BP 150/108 | HR 98 | Wt 380.0 lb

## 2018-05-09 DIAGNOSIS — F331 Major depressive disorder, recurrent, moderate: Secondary | ICD-10-CM

## 2018-05-09 DIAGNOSIS — E1169 Type 2 diabetes mellitus with other specified complication: Secondary | ICD-10-CM

## 2018-05-09 DIAGNOSIS — R4689 Other symptoms and signs involving appearance and behavior: Secondary | ICD-10-CM

## 2018-05-09 DIAGNOSIS — E66813 Obesity, class 3: Secondary | ICD-10-CM

## 2018-05-09 DIAGNOSIS — Z6841 Body Mass Index (BMI) 40.0 and over, adult: Secondary | ICD-10-CM

## 2018-05-09 DIAGNOSIS — E1165 Type 2 diabetes mellitus with hyperglycemia: Secondary | ICD-10-CM

## 2018-05-09 DIAGNOSIS — Z0100 Encounter for examination of eyes and vision without abnormal findings: Secondary | ICD-10-CM

## 2018-05-09 DIAGNOSIS — I10 Essential (primary) hypertension: Secondary | ICD-10-CM | POA: Insufficient documentation

## 2018-05-09 DIAGNOSIS — F411 Generalized anxiety disorder: Secondary | ICD-10-CM

## 2018-05-09 DIAGNOSIS — E785 Hyperlipidemia, unspecified: Secondary | ICD-10-CM

## 2018-05-09 DIAGNOSIS — E1159 Type 2 diabetes mellitus with other circulatory complications: Secondary | ICD-10-CM

## 2018-05-09 DIAGNOSIS — I152 Hypertension secondary to endocrine disorders: Secondary | ICD-10-CM | POA: Insufficient documentation

## 2018-05-09 DIAGNOSIS — E119 Type 2 diabetes mellitus without complications: Secondary | ICD-10-CM

## 2018-05-09 LAB — POCT GLYCOSYLATED HEMOGLOBIN (HGB A1C): HbA1c, POC (controlled diabetic range): 7.7 % — AB (ref 0.0–7.0)

## 2018-05-09 MED ORDER — LORAZEPAM 0.5 MG PO TABS: 0.5000 mg | ORAL_TABLET | Freq: Once | ORAL | 0 refills | Status: DC | PRN

## 2018-05-09 MED ORDER — BUPROPION HCL 100 MG PO TABS: 100.0000 mg | ORAL_TABLET | ORAL | 0 refills | Status: DC

## 2018-05-09 MED ORDER — FLUOXETINE HCL 10 MG PO CAPS: ORAL_CAPSULE | ORAL | 1 refills | Status: DC

## 2018-05-09 MED ORDER — SITAGLIP PHOS-METFORMIN HCL ER 50-500 MG PO TB24: 1.0000 | ORAL_TABLET | Freq: Every day | ORAL | 0 refills | Status: DC

## 2018-05-09 MED ORDER — ATORVASTATIN CALCIUM 20 MG PO TABS: 20.0000 mg | ORAL_TABLET | Freq: Every day | ORAL | 1 refills | Status: DC

## 2018-05-09 MED ORDER — VALSARTAN-HYDROCHLOROTHIAZIDE 160-25 MG PO TABS: 1.0000 | ORAL_TABLET | Freq: Every day | ORAL | 1 refills | Status: DC

## 2018-05-09 NOTE — Patient Instructions (Addendum)
Diabetes Preventive Care: - annual foot exam  - annual dilated eye exam with an eye doctor - self foot exams at least weekly - pneumonia vaccine once (booster in 5 years and at age 33) - annual influenza vaccine - twice yearly dental cleanings and yearly exam - goal blood pressure <140/90, ideally <130/80 - LDL cholesterol <70 - A1C <8.9 - body mass index (BMI) <25.0 - follow-up every 3 months if your A1C is not at goal - follow-up every 6 months if diabetes is well controlled  For your blood pressure: - Goal <130/80 (Ideally 120's/70's) - take your blood pressure medication in the morning (unless instructed differently) - monitor and log blood pressures at home - check around the same time each day in a relaxed setting - Limit salt to <2500 mg/day - Follow DASH (Dietary Approach to Stopping Hypertension) eating plan - Try to get at least 150 minutes of aerobic exercise per week - Aim to go on a brisk walk 30 minutes per day at least 5 days per week. If you're not active, gradually increase how long you walk by 5 minutes each week - limit alcohol: 2 standard drinks per day for men and 1 per day for women - avoid tobacco/nicotine products. Consider smoking cessation if you smoke - weight loss: 7% of current body weight can reduce your blood pressure by 5-10 points - follow-up at least every 6 months for your blood pressure. Follow-up sooner if your BP is not controlled  DASH Eating Plan DASH stands for "Dietary Approaches to Stop Hypertension." The DASH eating plan is a healthy eating plan that has been shown to reduce high blood pressure (hypertension). It may also reduce your risk for type 2 diabetes, heart disease, and stroke. The DASH eating plan may also help with weight loss. What are tips for following this plan?  General guidelines  Avoid eating more than 2,300 mg (milligrams) of salt (sodium) a day. If you have hypertension, you may need to reduce your sodium intake to 1,500  mg a day.  Limit alcohol intake to no more than 1 drink a day for nonpregnant women and 2 drinks a day for men. One drink equals 12 oz of beer, 5 oz of wine, or 1 oz of hard liquor.  Work with your health care provider to maintain a healthy body weight or to lose weight. Ask what an ideal weight is for you.  Get at least 30 minutes of exercise that causes your heart to beat faster (aerobic exercise) most days of the week. Activities may include walking, swimming, or biking.  Work with your health care provider or diet and nutrition specialist (dietitian) to adjust your eating plan to your individual calorie needs. Reading food labels   Check food labels for the amount of sodium per serving. Choose foods with less than 5 percent of the Daily Value of sodium. Generally, foods with less than 300 mg of sodium per serving fit into this eating plan.  To find whole grains, look for the word "whole" as the first word in the ingredient list. Shopping  Buy products labeled as "low-sodium" or "no salt added."  Buy fresh foods. Avoid canned foods and premade or frozen meals. Cooking  Avoid adding salt when cooking. Use salt-free seasonings or herbs instead of table salt or sea salt. Check with your health care provider or pharmacist before using salt substitutes.  Do not fry foods. Cook foods using healthy methods such as baking, boiling, grilling, and broiling  instead.  Cook with heart-healthy oils, such as olive, canola, soybean, or sunflower oil. Meal planning  Eat a balanced diet that includes: ? 5 or more servings of fruits and vegetables each day. At each meal, try to fill half of your plate with fruits and vegetables. ? Up to 6-8 servings of whole grains each day. ? Less than 6 oz of lean meat, poultry, or fish each day. A 3-oz serving of meat is about the same size as a deck of cards. One egg equals 1 oz. ? 2 servings of low-fat dairy each day. ? A serving of nuts, seeds, or beans 5  times each week. ? Heart-healthy fats. Healthy fats called Omega-3 fatty acids are found in foods such as flaxseeds and coldwater fish, like sardines, salmon, and mackerel.  Limit how much you eat of the following: ? Canned or prepackaged foods. ? Food that is high in trans fat, such as fried foods. ? Food that is high in saturated fat, such as fatty meat. ? Sweets, desserts, sugary drinks, and other foods with added sugar. ? Full-fat dairy products.  Do not salt foods before eating.  Try to eat at least 2 vegetarian meals each week.  Eat more home-cooked food and less restaurant, buffet, and fast food.  When eating at a restaurant, ask that your food be prepared with less salt or no salt, if possible. What foods are recommended? The items listed may not be a complete list. Talk with your dietitian about what dietary choices are best for you. Grains Whole-grain or whole-wheat bread. Whole-grain or whole-wheat pasta. Brown rice. Karen Stokes. Quinoa. Bulgur. Whole-grain and low-sodium cereals. Pita bread. Low-fat, low-sodium crackers. Whole-wheat flour tortillas. Vegetables Fresh or frozen vegetables (raw, steamed, roasted, or grilled). Low-sodium or reduced-sodium tomato and vegetable juice. Low-sodium or reduced-sodium tomato sauce and tomato paste. Low-sodium or reduced-sodium canned vegetables. Fruits All fresh, dried, or frozen fruit. Canned fruit in natural juice (without added sugar). Meat and other protein foods Skinless chicken or Karen Stokes. Ground chicken or Karen Stokes. Pork with fat trimmed off. Fish and seafood. Egg whites. Dried beans, peas, or lentils. Unsalted nuts, nut butters, and seeds. Unsalted canned beans. Lean cuts of beef with fat trimmed off. Low-sodium, lean deli meat. Dairy Low-fat (1%) or fat-free (skim) milk. Fat-free, low-fat, or reduced-fat cheeses. Nonfat, low-sodium ricotta or cottage cheese. Low-fat or nonfat yogurt. Low-fat, low-sodium cheese. Fats and oils Soft  margarine without trans fats. Vegetable oil. Low-fat, reduced-fat, or light mayonnaise and salad dressings (reduced-sodium). Canola, safflower, olive, soybean, and sunflower oils. Avocado. Seasoning and other foods Herbs. Spices. Seasoning mixes without salt. Unsalted popcorn and pretzels. Fat-free sweets. What foods are not recommended? The items listed may not be a complete list. Talk with your dietitian about what dietary choices are best for you. Grains Baked goods made with fat, such as croissants, muffins, or some breads. Dry pasta or rice meal packs. Vegetables Creamed or fried vegetables. Vegetables in a cheese sauce. Regular canned vegetables (not low-sodium or reduced-sodium). Regular canned tomato sauce and paste (not low-sodium or reduced-sodium). Regular tomato and vegetable juice (not low-sodium or reduced-sodium). Karen Stokes. Olives. Fruits Canned fruit in a light or heavy syrup. Fried fruit. Fruit in cream or butter sauce. Meat and other protein foods Fatty cuts of meat. Ribs. Fried meat. Karen Stokes. Sausage. Bologna and other processed lunch meats. Salami. Fatback. Hotdogs. Bratwurst. Salted nuts and seeds. Canned beans with added salt. Canned or smoked fish. Whole eggs or egg yolks. Chicken or Karen Stokes with skin.  Dairy Whole or 2% milk, cream, and half-and-half. Whole or full-fat cream cheese. Whole-fat or sweetened yogurt. Full-fat cheese. Nondairy creamers. Whipped toppings. Processed cheese and cheese spreads. Fats and oils Butter. Stick margarine. Lard. Shortening. Ghee. Bacon fat. Tropical oils, such as coconut, palm kernel, or palm oil. Seasoning and other foods Salted popcorn and pretzels. Onion salt, garlic salt, seasoned salt, table salt, and sea salt. Worcestershire sauce. Tartar sauce. Barbecue sauce. Teriyaki sauce. Soy sauce, including reduced-sodium. Steak sauce. Canned and packaged gravies. Fish sauce. Oyster sauce. Cocktail sauce. Horseradish that you find on the shelf.  Ketchup. Mustard. Meat flavorings and tenderizers. Bouillon cubes. Hot sauce and Tabasco sauce. Premade or packaged marinades. Premade or packaged taco seasonings. Relishes. Regular salad dressings. Where to find more information:  National Heart, Lung, and Blood Institute: Pop[employer].iswww.nhlbi.nih.gov  American Heart Association: www.heart.org Summary  The DASH eating plan is a healthy eating plan that has been shown to reduce high blood pressure (hypertension). It may also reduce your risk for type 2 diabetes, heart disease, and stroke.  With the DASH eating plan, you should limit salt (sodium) intake to 2,300 mg a day. If you have hypertension, you may need to reduce your sodium intake to 1,500 mg a day.  When on the DASH eating plan, aim to eat more fresh fruits and vegetables, whole grains, lean proteins, low-fat dairy, and heart-healthy fats.  Work with your health care provider or diet and nutrition specialist (dietitian) to adjust your eating plan to your individual calorie needs. This information is not intended to replace advice given to you by your health care provider. Make sure you discuss any questions you have with your health care provider. Document Released: 03/19/2011 Document Revised: 03/23/2016 Document Reviewed: 03/23/2016 Elsevier Interactive Patient Education  2019 [employer]

## 2018-05-09 NOTE — Progress Notes (Signed)
HPI:                                                                Karen Stokes is a 33 y.o. female who presents to [name] Medicine today for diabetes and HTN  f/u  DMII: new diagnosis 3 months ago, A1C 8.4. Reports she is having severe diarrhea with Metformin 500 mg twice a day. Denies polydipsia, polyuria, polyphagia. Denies blurred vision or vision change. Denies extremity pain, altered sensation and paresthesias.  Denies ulcers/wounds on feet.  HTN: taking Valsartan-HCTZ daily. Compliant with medications however she did not take her medication this morning. Denies vision change, chest pain with exertion, orthopnea, lightheadedness, syncope and edema. Risk factors include:  Depression/Anxiety: has been taking Wellbutrin 150 mg daily for the last 2 months. Reports she has no desire to do anything any more. Reports her anxiety is "out of control" and has been having overwhelming feeling that something bad is going to happen. Reports her job is up in the air and there is a lot of stress. She states she has a chronic problem of "fidgeting with my toes," where she has to wear open toed shoes because she does not like the sensation of her toes touching. Reports she will actually put holes in her blankets by rubbing her toes against them. She also has some compulsive behaviors regarding moisturizing her skin and blowing her nose. Denies symptoms of mania/hypomania. Denies suicidal thinking. Denies auditory/visual hallucinations.   Depression screen [facility] 2/9 05/09/2018 02/14/2018 02/14/2018 02/01/2018  Decreased Interest 2 1 1 3   Down, Depressed, Hopeless 2 2 2 3   PHQ - 2 Score 4 3 3 6   Altered sleeping 3 1 1 3   Tired, decreased energy 3 1 1 3   Change in appetite 3 2 2 3   Feeling bad or failure about yourself  3 2 2 3   Trouble concentrating 2 0 0 3  Moving slowly or fidgety/restless 3 2 2 3   Suicidal thoughts 0 0 0 0  PHQ-9 Score 21 11 11 24    Difficult doing work/chores Extremely dIfficult Somewhat difficult Somewhat difficult Extremely dIfficult    GAD 7 : Generalized Anxiety Score 05/09/2018 02/14/2018 02/14/2018 02/01/2018  Nervous, Anxious, on Edge 3 2 2 3   Control/stop worrying 3 2 2 3   Worry too much - different things 3 2 2 3   Trouble relaxing 2 2 2 3   Restless 2 1 1 3   Easily annoyed or irritable 3 1 1 3   Afraid - awful might happen 3 3 3 3   Total GAD 7 Score 19 13 13 21   Anxiety Difficulty Extremely difficult Somewhat difficult Somewhat difficult Extremely difficult      Past Medical History:  Diagnosis Date  . Allergy   . Anxiety   . Depression   . Eosinophilic esophagitis   . GERD (gastroesophageal reflux disease)   . Hypertension   . Irritable bowel syndrome   . Obesity    Past Surgical History:  Procedure Laterality Date  . CHOLECYSTECTOMY    . ESOPHAGEAL DILATION    . TONSILLECTOMY     Social History   Tobacco Use  . Smoking status: Never Smoker  . Smokeless tobacco: Never Used  Substance Use Topics  . Alcohol use:  Never    Frequency: Never   family history includes Heart attack in her maternal grandfather; Hypertension in her maternal grandfather; Stroke in her maternal grandfather.    ROS: negative except as noted in the HPI  Medications: Current Outpatient Medications  Medication Sig Dispense Refill  . atorvastatin (LIPITOR) 20 MG tablet Take 1 tablet (20 mg total) by mouth at bedtime. 90 tablet 1  . buPROPion (WELLBUTRIN) 100 MG tablet Take 1 tablet (100 mg total) by mouth every morning. 90 tablet 0  . FLUoxetine (PROZAC) 10 MG capsule Take 1 capsule (10 mg total) by mouth at bedtime for 3 days, THEN 2 capsules (20 mg total) at bedtime for 27 days. 60 capsule 1  . levonorgestrel (MIRENA) 20 MCG/24HR IUD 1 each by Intrauterine route once.    Marland Kitchen. LORazepam (ATIVAN) 0.5 MG tablet Take 1 tablet (0.5 mg total) by mouth once as needed for up to 1 dose for anxiety (panic attack). 10 tablet 0   . omeprazole (PRILOSEC) 20 MG capsule Take 1 capsule by mouth daily.    . SitaGLIPtin-MetFORMIN HCl (JANUMET XR) 50-500 MG TB24 Take 1 tablet by mouth daily with supper. 90 tablet 0  . SUMAtriptan (IMITREX) 50 MG tablet Take 1-2 tablets (50-100 mg total) by mouth once as needed for up to 1 dose for migraine. Repeat in 2 hours if needed 10 tablet 2  . valsartan-hydrochlorothiazide (DIOVAN-HCT) 160-25 MG tablet Take 1 tablet by mouth daily. 90 tablet 1   No current facility-administered medications for this visit.    Allergies  Allergen Reactions  . Amoxicillin-Pot Clavulanate Nausea And Vomiting  . Morphine And Related Other (See Comments)    headaches   . Trileptal [Oxcarbazepine] Other (See Comments)    hallucinations       Objective:  BP (!) 150/108   Pulse 98   Wt (!) 380 lb (172.4 kg)   BMI 65.23 kg/m  Gen:  alert, not ill-appearing, no distress, appropriate for age, obese female HEENT: head normocephalic without obvious abnormality, conjunctiva and cornea clear, trachea midline Pulm: Normal work of breathing, normal phonation Neuro: alert and oriented x 3, no tremor MSK: wearing thumb spica splint on right wrist, normal gait and station Skin: intact, no rashes on exposed skin, no jaundice, no cyanosis Psych: appearance casual, cooperative, good eye contact, depressed mood, affect full range, speech is articulate, and thought processes clear and goal-directed    Lab Results  Component Value Date   CREATININE 0.79 02/03/2018   BUN 12 02/03/2018   NA 136 02/03/2018   K 4.1 02/03/2018   CL 103 02/03/2018   CO2 25 02/03/2018   Lab Results  Component Value Date   ALT 23 02/03/2018   AST 13 02/03/2018   BILITOT 0.5 02/03/2018    Lab Results  Component Value Date   HGBA1C 7.7 (A) 05/09/2018   Lab Results  Component Value Date   CHOL 219 (H) 02/03/2018   HDL 37 (L) 02/03/2018   LDLCALC 143 (H) 02/03/2018   TRIG 253 (H) 02/03/2018   CHOLHDL 5.9 (H)  02/03/2018     No results found for this or any previous visit (from the past 72 hour(s)). No results found.    Assessment and Plan: 33 y.o. female with   .[name] was seen today for hyperglycemia.  Diagnoses and all orders for this visit:  Uncontrolled type 2 diabetes mellitus with hyperglycemia (HCC) -     POCT HgB A1C -     SitaGLIPtin-MetFORMIN HCl (JANUMET  XR) 50-500 MG TB24; Take 1 tablet by mouth daily with supper. -     Ambulatory referral to Ophthalmology -     Ambulatory referral to diabetic education  Hyperlipidemia associated with type 2 diabetes mellitus (HCC) -     atorvastatin (LIPITOR) 20 MG tablet; Take 1 tablet (20 mg total) by mouth at bedtime. -     Ambulatory referral to diabetic education  Hypertension associated with diabetes (HCC) -     valsartan-hydrochlorothiazide (DIOVAN-HCT) 160-25 MG tablet; Take 1 tablet by mouth daily.  GAD (generalized anxiety disorder) -     FLUoxetine (PROZAC) 10 MG capsule; Take 1 capsule (10 mg total) by mouth at bedtime for 3 days, THEN 2 capsules (20 mg total) at bedtime for 27 days. -     LORazepam (ATIVAN) 0.5 MG tablet; Take 1 tablet (0.5 mg total) by mouth once as needed for up to 1 dose for anxiety (panic attack).  Compulsive behaviors  Moderate episode of recurrent major depressive disorder (HCC) -     FLUoxetine (PROZAC) 10 MG capsule; Take 1 capsule (10 mg total) by mouth at bedtime for 3 days, THEN 2 capsules (20 mg total) at bedtime for 27 days. -     buPROPion (WELLBUTRIN) 100 MG tablet; Take 1 tablet (100 mg total) by mouth every morning.  Diabetic eye exam (HCC) -     Ambulatory referral to Ophthalmology  Class 3 severe obesity due to excess calories with serious comorbidity and body mass index (BMI) of 60.0 to 69.9 in adult [facility]) -     Ambulatory referral to diabetic education   Type 2 Diabetes A1c improved at 7.7 today, goal <7.0 Switching from Metformin 500 mg bid to Janumet XR 50-500 mg due to  intolerance with immediate release Metformin and need for glycemic control I am also referring her to diabetes educator due to multiple co-morbidities to assist with nutritional plan Referral placed for diabetic eye exam LDL goal <70, starting Atorvastatin 20 mg QHS BP goal <140/90, out of range today due to not taking medication, cont Arb-thiazide Influenza and Pneumovax declined  Class III obesity Referral placed to nutritionist Counseled on weight loss through decreased caloric intake and increase aerobic exercise  Recurrent major depressive disorder PHQ 9 equals 21, no acute safety issues Gad 7 equals 19 Given presence of compulsive behaviors adding Prozac, self titrate to 20 mg daily Given her increased anxiety symptoms I am reducing her Wellbutrin to 100 mg daily  Patient education and anticipatory guidance given Patient agrees with treatment plan Follow-up in 1 month for depression or sooner as needed if symptoms worsen or fail to improve  Levonne Hubert PA-C

## 2018-05-12 ENCOUNTER — Encounter: Payer: Self-pay | Admitting: Physician Assistant

## 2018-05-18 ENCOUNTER — Ambulatory Visit: Payer: Self-pay | Admitting: Registered"

## 2018-05-18 ENCOUNTER — Other Ambulatory Visit: Payer: Self-pay | Admitting: Physician Assistant

## 2018-05-18 DIAGNOSIS — F332 Major depressive disorder, recurrent severe without psychotic features: Secondary | ICD-10-CM

## 2018-06-01 ENCOUNTER — Other Ambulatory Visit: Payer: Self-pay | Admitting: Physician Assistant

## 2018-06-01 DIAGNOSIS — F331 Major depressive disorder, recurrent, moderate: Secondary | ICD-10-CM

## 2018-06-01 DIAGNOSIS — F411 Generalized anxiety disorder: Secondary | ICD-10-CM

## 2018-06-06 ENCOUNTER — Ambulatory Visit: Payer: Self-pay | Admitting: Physician Assistant

## 2018-08-11 ENCOUNTER — Other Ambulatory Visit: Payer: Self-pay | Admitting: Physician Assistant

## 2018-08-11 DIAGNOSIS — E1165 Type 2 diabetes mellitus with hyperglycemia: Secondary | ICD-10-CM

## 2018-09-07 ENCOUNTER — Other Ambulatory Visit: Payer: Self-pay | Admitting: Physician Assistant

## 2018-09-07 DIAGNOSIS — F331 Major depressive disorder, recurrent, moderate: Secondary | ICD-10-CM

## 2018-09-20 ENCOUNTER — Other Ambulatory Visit: Payer: Self-pay | Admitting: Physician Assistant

## 2018-09-20 DIAGNOSIS — F331 Major depressive disorder, recurrent, moderate: Secondary | ICD-10-CM

## 2018-09-21 MED ORDER — BUPROPION HCL 100 MG PO TABS: 100.0000 mg | ORAL_TABLET | Freq: Every day | ORAL | 0 refills | Status: DC

## 2018-09-21 NOTE — Addendum Note (Signed)
Addended by: Nelson Chimes E on: 09/21/2018 11:09 AM   Modules accepted: Orders

## 2018-12-20 ENCOUNTER — Other Ambulatory Visit: Payer: Self-pay

## 2018-12-20 ENCOUNTER — Ambulatory Visit (INDEPENDENT_AMBULATORY_CARE_PROVIDER_SITE_OTHER): Payer: BC Managed Care – PPO | Admitting: Physician Assistant

## 2018-12-20 VITALS — BP 165/111 | HR 100 | Temp 99.2°F | Ht 65.0 in | Wt 375.0 lb

## 2018-12-20 DIAGNOSIS — E1165 Type 2 diabetes mellitus with hyperglycemia: Secondary | ICD-10-CM

## 2018-12-20 DIAGNOSIS — R202 Paresthesia of skin: Secondary | ICD-10-CM

## 2018-12-20 DIAGNOSIS — R200 Anesthesia of skin: Secondary | ICD-10-CM | POA: Diagnosis not present

## 2018-12-20 DIAGNOSIS — I10 Essential (primary) hypertension: Secondary | ICD-10-CM | POA: Diagnosis not present

## 2018-12-20 DIAGNOSIS — Z8619 Personal history of other infectious and parasitic diseases: Secondary | ICD-10-CM

## 2018-12-20 LAB — POCT GLYCOSYLATED HEMOGLOBIN (HGB A1C): Hemoglobin A1C: 12.1 % — AB (ref 4.0–5.6)

## 2018-12-20 MED ORDER — PREDNISONE 50 MG PO TABS: ORAL_TABLET | ORAL | 0 refills | Status: DC

## 2018-12-20 MED ORDER — GABAPENTIN 100 MG PO CAPS: 100.0000 mg | ORAL_CAPSULE | Freq: Three times a day (TID) | ORAL | 0 refills | Status: DC

## 2018-12-20 MED ORDER — DAPAGLIFLOZIN PROPANEDIOL 10 MG PO TABS: 10.0000 mg | ORAL_TABLET | Freq: Every day | ORAL | 2 refills | Status: DC

## 2018-12-20 MED ORDER — VALACYCLOVIR HCL 1 G PO TABS: 1000.0000 mg | ORAL_TABLET | Freq: Three times a day (TID) | ORAL | 0 refills | Status: DC

## 2018-12-20 MED ORDER — OZEMPIC (0.25 OR 0.5 MG/DOSE) 2 MG/1.5ML ~~LOC~~ SOPN: 0.2500 mg | PEN_INJECTOR | SUBCUTANEOUS | 2 refills | Status: DC

## 2018-12-20 NOTE — Patient Instructions (Signed)
Stop janumet.  Start farxiga daily.  Start ozempic weekly .25mg .   Gabapentin as needed.  Valtrex for 7 days.  Prednisone for 5 days.   Get lumbar xray.

## 2018-12-20 NOTE — Progress Notes (Signed)
Subjective:    Patient ID: Karen Stokes, female    DOB: 12/27/1985, 33 y.o.   MRN: [id number]  HPI  Patient is a 33 year old morbidly obese female with uncontrolled hypertension, uncontrolled diabetes, and history of low back pain who presents to the clinic with left lateral leg burning.  Patient admits she has been noncompliant with medications since about February of this year.  She is not taking her diabetes medication nor her blood pressure medication.  She is having frequent headaches and flushing.  She admits to being extremely thirsty.  Her lateral left leg burning started about 2 days ago.  She has never had anything like this before.  It starts at her hip and goes to her knee.  She denies any numbness and tingling of her feet.  She denies any recent back injury.  She denies any of her ongoing back pain being a current problem right now.  She does not feel like this is her back.  She does feel like walking makes it worse.  Sitting still she would rated 4 out of 10 but with walking it is a 6 out of 10.  She denies any leg weakness.  She denies any rash.  She does have a history of shingles ; however it was on her face.  Patient denies any bowel or bladder dysfunction or saddle anesthesia. She did have some left over gabapentin that she took and helped her sleep last night.   .. Active Ambulatory Problems    Diagnosis Date Noted  . Eosinophilic esophagitis   . Irritable bowel syndrome   . Palpitations 02/01/2018  . Class 3 severe obesity due to excess calories with body mass index (BMI) of 60.0 to 69.9 in adult (HCC) 02/01/2018  . Uncontrolled stage 2 hypertension 02/01/2018  . Bilateral low back pain without sciatica 02/01/2018  . Dyspnea on exertion 02/01/2018  . GAD (generalized anxiety disorder) 02/01/2018  . Severe episode of recurrent major depressive disorder, without psychotic features (HCC) 02/01/2018  . Uncontrolled type 2 diabetes mellitus with hyperglycemia (HCC) 02/13/2018   . Intractable migraine without aura and without status migrainosus 03/17/2018  . Encounter for routine checking of intrauterine contraceptive device (IUD) 03/17/2018  . Hyperlipidemia associated with type 2 diabetes mellitus (HCC) 05/09/2018  . Hypertension associated with diabetes (HCC) 05/09/2018  . Compulsive behaviors 05/09/2018  . Diabetic eye exam (HCC) 05/09/2018  . History of shingles 12/21/2018   Resolved Ambulatory Problems    Diagnosis Date Noted  . No Resolved Ambulatory Problems   Past Medical History:  Diagnosis Date  . Allergy   . Anxiety   . Depression   . GERD (gastroesophageal reflux disease)   . Hypertension   . Obesity       Review of Systems See HPI.     Objective:   Physical Exam Constitutional:      Appearance: Normal appearance. She is obese.  Cardiovascular:     Rate and Rhythm: Normal rate and regular rhythm.     Pulses: Normal pulses.  Pulmonary:     Effort: Pulmonary effort is normal.     Breath sounds: Normal breath sounds.  Musculoskeletal:     Comments: ROM at waist without pain.  No tenderness over lumbar spine and paraspinal muscles.  Strength lower ext 5/5.  Negative straight leg test.  Skin:    Comments: No redness or rash over left lateral leg.   Neurological:     General: No focal deficit present.  Mental Status: She is alert and oriented to person, place, and time.     Sensory: No sensory deficit.     Motor: No weakness.     Coordination: Coordination normal.     Gait: Gait normal.     Deep Tendon Reflexes: Reflexes normal.     Comments: No lower ext swelling.   Psychiatric:        Mood and Affect: Mood normal.           Assessment & Plan:  Marland KitchenMarland KitchenMeilah was seen today for leg pain.  Diagnoses and all orders for this visit:  Numbness and tingling of left leg -     predniSONE (DELTASONE) 50 MG tablet; One tab PO daily for 5 days. -     DG Lumbar Spine Complete -     gabapentin (NEURONTIN) 100 MG capsule; Take 1  capsule (100 mg total) by mouth 3 (three) times daily. -     valACYclovir (VALTREX) 1000 MG tablet; Take 1 tablet (1,000 mg total) by mouth 3 (three) times daily. For 7 days.  Uncontrolled type 2 diabetes mellitus with hyperglycemia (HCC) -     POCT glycosylated hemoglobin (Hb A1C) -     dapagliflozin propanediol (FARXIGA) 10 MG TABS tablet; Take 10 mg by mouth daily. -     Semaglutide,0.25 or 0.5MG /DOS, (OZEMPIC, 0.25 OR 0.5 MG/DOSE,) 2 MG/1.5ML SOPN; Inject 0.25 mg into the skin once a week.  Uncontrolled stage 2 hypertension  History of shingles -     predniSONE (DELTASONE) 50 MG tablet; One tab PO daily for 5 days. -     valACYclovir (VALTREX) 1000 MG tablet; Take 1 tablet (1,000 mg total) by mouth 3 (three) times daily. For 7 days.   .. Results for orders placed or performed in visit on 12/20/18  POCT glycosylated hemoglobin (Hb A1C)  Result Value Ref Range   Hemoglobin A1C 12.1 (A) 4.0 - 5.6 %   HbA1c POC (<> result, manual entry)     HbA1c, POC (prediabetic range)     HbA1c, POC (controlled diabetic range)     Given 6 months since her A1c was last checked.  It was 7.7 in January 2020.  It is 12.1 today.  Discussed with patient the urgency of treating her diabetes.  She mentioned that she did not like the Janumet because of the GI upset.  We will start Janumet today.  Started faraxgia and ozempic.  Discussed how to use and potential side effects.  She denied any problems with recurrent yeast infections.  I do think this will also target her need for weight loss.  Unclear if patient's numbness and tingling today is a result of any diabetic neuropathy or if this is coming from her back.  Although she has had back pain in the past she has never had an x-ray.  Ordered lumbar x-ray.  There is also a possibility of shingles.  I am going to treat empirically with burst of prednisone, antiviral, gabapentin.  Certainly her blood pressure is very concerning.  But she has not taken her  medication.  Patient is aware of risk.  She will start her medication.  I would like her follow-up in 1 week to recheck her blood pressure.  Marland Kitchen.Spent 30 minutes with patient and greater than 50 percent of visit spent counseling patient regarding treatment plan.

## 2018-12-21 ENCOUNTER — Telehealth: Payer: Self-pay

## 2018-12-21 ENCOUNTER — Encounter: Payer: Self-pay | Admitting: Physician Assistant

## 2018-12-21 DIAGNOSIS — Z8619 Personal history of other infectious and parasitic diseases: Secondary | ICD-10-CM | POA: Insufficient documentation

## 2018-12-21 MED ORDER — TRULICITY 0.75 MG/0.5ML ~~LOC~~ SOAJ: 0.7500 mg | SUBCUTANEOUS | 2 refills | Status: DC

## 2018-12-21 NOTE — Telephone Encounter (Signed)
I would suspect it is the valtrex. Confirm this and can send over acyclovir.

## 2018-12-21 NOTE — Telephone Encounter (Signed)
I called and spoke with [facility] and this is not a deductible. The pharmacist reports that this is not on the patients formulary. Ozempic does not need a PA. Do you have any other recommendations?

## 2018-12-21 NOTE — Telephone Encounter (Signed)
Karen Stokes left a message stating she can't afford the medication prescribed yesterday. She didn't leave the name of the medication. I called and left a message requesting a call back.

## 2018-12-21 NOTE — Telephone Encounter (Signed)
I called and the patient reports the Ozempic is too expensive for her. Its around $200. I would suspect this is a deductible.

## 2018-12-21 NOTE — Telephone Encounter (Signed)
Patient is going to call insurance and see what needs to be done for her medications. She will call the office if she needs anything from our office.

## 2018-12-21 NOTE — Telephone Encounter (Signed)
Ok I sent trulicity. It should be covered. Still weekly but you use one pen every week and do not have to put needle on it.

## 2018-12-21 NOTE — Telephone Encounter (Signed)
Patient has took a coupon card to [facility] and got the prescription for $89. She will take the Ozempic and try the new one at the next visit. She was appreciative of all the efforts. She did not have any further questions.

## 2018-12-22 DIAGNOSIS — R0602 Shortness of breath: Secondary | ICD-10-CM | POA: Diagnosis not present

## 2018-12-22 DIAGNOSIS — R079 Chest pain, unspecified: Secondary | ICD-10-CM | POA: Diagnosis not present

## 2018-12-23 ENCOUNTER — Encounter: Payer: Self-pay | Admitting: Family Medicine

## 2018-12-23 ENCOUNTER — Ambulatory Visit (INDEPENDENT_AMBULATORY_CARE_PROVIDER_SITE_OTHER): Payer: BC Managed Care – PPO | Admitting: Family Medicine

## 2018-12-23 ENCOUNTER — Other Ambulatory Visit: Payer: Self-pay

## 2018-12-23 VITALS — BP 130/78 | HR 62 | Temp 98.4°F | Ht 65.0 in | Wt 361.0 lb

## 2018-12-23 DIAGNOSIS — F418 Other specified anxiety disorders: Secondary | ICD-10-CM | POA: Diagnosis not present

## 2018-12-23 DIAGNOSIS — E860 Dehydration: Secondary | ICD-10-CM | POA: Diagnosis not present

## 2018-12-23 DIAGNOSIS — R42 Dizziness and giddiness: Secondary | ICD-10-CM | POA: Diagnosis not present

## 2018-12-23 DIAGNOSIS — R739 Hyperglycemia, unspecified: Secondary | ICD-10-CM | POA: Diagnosis not present

## 2018-12-23 DIAGNOSIS — R000 Tachycardia, unspecified: Secondary | ICD-10-CM | POA: Diagnosis not present

## 2018-12-23 MED ORDER — CLONAZEPAM 0.5 MG PO TABS: 0.5000 mg | ORAL_TABLET | Freq: Two times a day (BID) | ORAL | 1 refills | Status: DC | PRN

## 2018-12-23 NOTE — Progress Notes (Signed)
Karen MadridBrynn Stokes is a 33 y.o. female who [facility] [place] today for dizziness.  Karen EulerBrynn is going through a very challenging emotional time.  Her daughter was just admitted to the hospital for suicidal ideation about a day ago.  During her daughter's intake she developed vomiting and then became lightheaded and dizzy. Karen EulerBrynn was [facility] yesterday evening.  She had nurse intake EKG and initial lab assessment.  EKG not available in EMR at time of visit.  Per report heart rate was elevated.  Chest x-ray was normal per radiology report.  Labs were significant for findings consistent with dehydration with mildly elevated hemoglobin.  Blood sugar was elevated at 270s.  Anion gap was elevated at 17.  However she was unable to stay in the emergency department and did not complete her evaluation.  She notes that she has a history of not very well diabetes and was seen by PCP and started on Ozempic and Comor[place]Farxiga 2 days ago.  She denies any continued nausea or vomiting.  She is feeling a bit better after drinking a lot of fluids this morning.    ROS as above:  Exam:  Temp 98.4 F (36.9 C) (Oral)   Ht 5\' 5"  (1.651 m)   Wt (!) 361 lb (163.7 kg)   BMI 60.07 kg/m   Vitals:   12/23/18 0924 12/23/18 0951  BP:  130/78  Pulse:  62  Temp: 98.4 F (36.9 C)   TempSrc: Oral   Weight: (!) 361 lb (163.7 kg)   Height: 5\' 5"  (1.651 m)      Wt Readings from Last 5 Encounters:  12/23/18 (!) 361 lb (163.7 kg)  12/20/18 (!) 375 lb (170.1 kg)  05/09/18 (!) 380 lb (172.4 kg)  03/14/18 (!) 378 lb (171.5 kg)  02/14/18 (!) 381 lb 9.6 oz (173.1 kg)    Gen: Morbidly obese but nontoxic-appearing HEENT: EOMI,  MMM Lungs: Normal work of breathing. CTABL Heart: RRR no MRG Abd: NABS, Soft. Nondistended, Nontender Exts: Brisk capillary refill, warm and well perfused.    Lab and Radiology Results Results for orders placed or performed in visit on 12/20/18 (from the past 72 hour(s))  POCT glycosylated hemoglobin (Hb A1C)     Status: Abnormal   Collection Time: 12/20/18 11:44 AM  Result Value Ref Range   Hemoglobin A1C 12.1 (A) 4.0 - 5.6 %   HbA1c POC (<> result, manual entry)     HbA1c, POC (prediabetic range)     HbA1c, POC (controlled diabetic range)     No results found. Care Everywhere Result Report Troponin IResulted: 12/22/2018 9:29 PM [facility] Component Name Value Ref Range  Troponin I 8  Comment: >=18 pg/mL INDICATES MYOCARDIAL DAMAGE. THE DIAGNOSIS OF MYOCARDIAL INFARCTION REQUIRES CLINICAL CORRELATION. Elevated troponin may also be due to myocardial stress from a variety of causes. Consider gender and clinical presentation with troponin: 99th percentile URL - females <15 pg/mL; males <20 pg/mL. Alkaline phos (ALP) levels >400 U/L may cause falsely elevated results. Troponin test is invalid in patients taking asfotase alfa.  <18 pg/mL  Specimen Collected on  Blood 12/22/2018 8:35 PM   Care Everywhere Result Report Comprehensive Metabolic PanelResulted: 12/22/2018 9:26 PM [name] Component Name Value Ref Range  Sodium 135 135 - 146 MMOL/L  Potassium 3.9  Comment: NO VISIBLE HEMOLYSIS 3.5 - 5.3 MMOL/L  Chloride 96 (L) 98 -  110 MMOL/L  CO2 22 (L) 23 - 30 MMOL/L  BUN 12 8 - 24 MG/DL  Glucose 099 (H) 70 - 99 MG/DL  Creatinine 8.33 0.5 - 1.5 MG/DL  Calcium 9.7 8.5 - 82.5 MG/DL  Total Protein 7.5 6 - 8.3 G/DL  Albumin  4.6 3.5 - 5 G/DL  Total Bilirubin 1.0 0.1 - 1.2 MG/DL  Alkaline Phosphatase 053 (H) 25 - 125 IU/L or U/L  AST (SGOT) 29 5 - 40 IU/L or U/L  ALT (SGPT) 50 5 - 50 IU/L or U/L  Anion Gap 17 (H) 4 - 14 MMOL/L  Est. GFR Non-African American 72  Comment: GFR estimated by CKD-EPI equations, reportable up to 90 ML/MIN/1.73 M*2 >=60 ML/MIN/1.73 M*2   Specimen Collected on  Blood  12/22/2018 8:35 PM   XR CHEST PA AND LATERAL9/02/2019 [facility] Result Impression   There is no evidence of acute cardiac or pulmonary abnormality.  Result Narrative  XR CHEST PA AND LATERAL, 12/22/2018 9:04 PM  INDICATION:chest pain \ R07.9 Chest pain, unspecified type  COMPARISON: Chest radiograph 07/29/2008.  FINDINGS:   Cardiovascular: Cardiac silhouette and pulmonary vasculature are within normal limits. Mediastinum: Within normal limits. Lungs/pleura: No focal airspace opacity. No pneumothorax. No pleural effusions. Upper abdomen: Visualized portions are unremarkable.  Chest wall/osseous structures: Unremarkable.       Assessment and Plan: 33 y.o. female with lightheaded dizzy and anxiety.  Patient is suffering from extreme anxiety regarding her daughter's recent hospitalization.  I believe a lot of her symptoms are stemming from that.  We discussed options.  Plan for short-term low-dose Klonopin.  Cautioned against driving.  Recheck early next week.  Additionally patient has some dehydration labs and symptoms.  She is now able to eat and drink normally.  Plan to advance diet as tolerated and reassess next week with labs and an office visit.  Precautions reviewed.  Will obtain medical records in the meantime.  PDMP not reviewed this encounter. No orders of the defined types were placed in this encounter.  No orders of the defined types were placed in this encounter.    Historical information moved to improve visibility of documentation.  Past Medical History:  Diagnosis Date  . Allergy   . Anxiety   . Depression   . Eosinophilic esophagitis   . GERD (gastroesophageal reflux disease)   . Hypertension   . Irritable bowel syndrome   . Obesity    Past Surgical History:  Procedure Laterality Date  . CHOLECYSTECTOMY    . ESOPHAGEAL DILATION    . TONSILLECTOMY     Social History   Tobacco Use  . Smoking status: Never Smoker  .  Smokeless tobacco: Never Used  Substance Use Topics  . Alcohol use: Never    Frequency: Never   family history includes Heart attack in her maternal grandfather; Hypertension in her maternal grandfather; Stroke in her maternal grandfather.  Medications: Current Outpatient Medications  Medication Sig Dispense Refill  . dapagliflozin propanediol (FARXIGA) 10 MG TABS tablet Take 10 mg by mouth daily. 30 tablet 2  . Dulaglutide (TRULICITY) 0.75 MG/0.5ML SOPN Inject 0.75 mg into the skin once a week. 4 pen 2  . FLUoxetine (PROZAC) 10 MG capsule TAKE 1 CAPSULE (10 MG TOTAL) BY MOUTH AT BEDTIME FOR 3 DAYS, THEN 2 CAPSULES (20 MG TOTAL) AT BEDTIME FOR 27 DAYS. 180 capsule 1  . gabapentin (NEURONTIN) 100 MG capsule Take 1 capsule (100 mg total) by mouth 3 (three) times daily.  90 capsule 0  . levonorgestrel (MIRENA) 20 MCG/24HR IUD 1 each by Intrauterine route once.    Marland Kitchen omeprazole (PRILOSEC) 20 MG capsule Take 1 capsule by mouth daily.    . predniSONE (DELTASONE) 50 MG tablet One tab PO daily for 5 days. 5 tablet 0  . Semaglutide,0.25 or 0.5MG /DOS, (OZEMPIC, 0.25 OR 0.5 MG/DOSE,) 2 MG/1.5ML SOPN Inject 0.25 mg into the skin once a week. 1 pen 2  . valACYclovir (VALTREX) 1000 MG tablet Take 1 tablet (1,000 mg total) by mouth 3 (three) times daily. For 7 days. 21 tablet 0  . valsartan-hydrochlorothiazide (DIOVAN-HCT) 160-25 MG tablet Take 1 tablet by mouth daily. 90 tablet 1   No current facility-administered medications for this visit.    Allergies  Allergen Reactions  . Amoxicillin-Pot Clavulanate Nausea And Vomiting  . Morphine And Related Other (See Comments)    headaches   . Trileptal [Oxcarbazepine] Other (See Comments)    hallucinations     Discussed warning signs or symptoms. Please see discharge instructions. Patient expresses understanding.

## 2018-12-23 NOTE — Patient Instructions (Addendum)
Thank you for coming in today. Continue current medications. Drink plenty of fluids. Recheck early next week with Charlie or myself  Take clonopin up to 2x daily for severe anxiety as needed.  Do not drive with this medicine.    Dehydration, Adult  Dehydration is when there is not enough fluid or water in your body. This happens when you lose more fluids than you take in. Dehydration can range from mild to very bad. It should be treated right away to keep it from getting very bad. Symptoms of mild dehydration may include:  Thirst.  Dry lips.  Slightly dry mouth.  Dry, warm skin.  Dizziness. Symptoms of moderate dehydration may include:  Very dry mouth.  Muscle cramps.  Dark pee (urine). Pee may be the color of tea.  Your body making less pee.  Your eyes making fewer tears.  Heartbeat that is uneven or faster than normal (palpitations).  Headache.  Light-headedness, especially when you stand up from sitting.  Fainting (syncope). Symptoms of very bad dehydration may include:  Changes in skin, such as: ? Cold and clammy skin. ? Blotchy (mottled) or pale skin. ? Skin that does not quickly return to normal after being lightly pinched and let go (poor skin turgor).  Changes in body fluids, such as: ? Feeling very thirsty. ? Your eyes making fewer tears. ? Not sweating when body temperature is high, such as in hot weather. ? Your body making very little pee.  Changes in vital signs, such as: ? Weak pulse. ? Pulse that is more than 100 beats a minute when you are sitting still. ? Fast breathing. ? Low blood pressure.  Other changes, such as: ? Sunken eyes. ? Cold hands and feet. ? Confusion. ? Lack of energy (lethargy). ? Trouble waking up from sleep. ? Short-term weight loss. ? Unconsciousness. Follow these instructions at home:   If told by your doctor, drink an ORS: ? Make an ORS by using instructions on the package. ? Start by drinking small  amounts, about  cup (120 mL) every 5-10 minutes. ? Slowly drink more until you have had the amount that your doctor said to have.  Drink enough clear fluid to keep your pee clear or pale yellow. If you were told to drink an ORS, finish the ORS first, then start slowly drinking clear fluids. Drink fluids such as: ? Water. Do not drink only water by itself. Doing that can make the salt (sodium) level in your body get too low (hyponatremia). ? Ice chips. ? Fruit juice that you have added water to (diluted). ? Low-calorie sports drinks.  Avoid: ? Alcohol. ? Drinks that have a lot of sugar. These include high-calorie sports drinks, fruit juice that does not have water added, and soda. ? Caffeine. ? Foods that are greasy or have a lot of fat or sugar.  Take over-the-counter and prescription medicines only as told by your doctor.  Do not take salt tablets. Doing that can make the salt level in your body get too high (hypernatremia).  Eat foods that have minerals (electrolytes). Examples include bananas, oranges, potatoes, tomatoes, and spinach.  Keep all follow-up visits as told by your doctor. This is important. Contact a doctor if:  You have belly (abdominal) pain that: ? Gets worse. ? Stays in one area (localizes).  You have a rash.  You have a stiff neck.  You get angry or annoyed more easily than normal (irritability).  You are more sleepy than normal.  You have a harder time waking up than normal.  You feel: ? Weak. ? Dizzy. ? Very thirsty.  You have peed (urinated) only a small amount of very dark pee during 6-8 hours. Get help right away if:  You have symptoms of very bad dehydration.  You cannot drink fluids without throwing up (vomiting).  Your symptoms get worse with treatment.  You have a fever.  You have a very bad headache.  You are throwing up or having watery poop (diarrhea) and it: ? Gets worse. ? Does not go away.  You have blood or something  green (bile) in your throw-up.  You have blood in your poop (stool). This may cause poop to look black and tarry.  You have not peed in 6-8 hours.  You pass out (faint).  Your heart rate when you are sitting still is more than 100 beats a minute.  You have trouble breathing. This information is not intended to replace advice given to you by your health care provider. Make sure you discuss any questions you have with your health care provider. Document Released: 01/24/2009 Document Revised: 03/12/2017 Document Reviewed: 05/24/2015 Elsevier Patient Education  2020 [employer].

## 2018-12-27 ENCOUNTER — Ambulatory Visit (INDEPENDENT_AMBULATORY_CARE_PROVIDER_SITE_OTHER): Payer: BC Managed Care – PPO | Admitting: Physician Assistant

## 2018-12-27 ENCOUNTER — Other Ambulatory Visit: Payer: Self-pay

## 2018-12-27 ENCOUNTER — Encounter: Payer: Self-pay | Admitting: Physician Assistant

## 2018-12-27 ENCOUNTER — Ambulatory Visit (INDEPENDENT_AMBULATORY_CARE_PROVIDER_SITE_OTHER): Payer: BC Managed Care – PPO

## 2018-12-27 VITALS — BP 109/76 | HR 99 | Temp 98.1°F | Wt 363.0 lb

## 2018-12-27 DIAGNOSIS — E1165 Type 2 diabetes mellitus with hyperglycemia: Secondary | ICD-10-CM

## 2018-12-27 DIAGNOSIS — R002 Palpitations: Secondary | ICD-10-CM

## 2018-12-27 DIAGNOSIS — Z8639 Personal history of other endocrine, nutritional and metabolic disease: Secondary | ICD-10-CM | POA: Diagnosis not present

## 2018-12-27 DIAGNOSIS — R202 Paresthesia of skin: Secondary | ICD-10-CM | POA: Diagnosis not present

## 2018-12-27 DIAGNOSIS — R200 Anesthesia of skin: Secondary | ICD-10-CM

## 2018-12-27 DIAGNOSIS — F411 Generalized anxiety disorder: Secondary | ICD-10-CM

## 2018-12-27 DIAGNOSIS — D582 Other hemoglobinopathies: Secondary | ICD-10-CM | POA: Diagnosis not present

## 2018-12-27 DIAGNOSIS — R7989 Other specified abnormal findings of blood chemistry: Secondary | ICD-10-CM | POA: Diagnosis not present

## 2018-12-27 DIAGNOSIS — R718 Other abnormality of red blood cells: Secondary | ICD-10-CM

## 2018-12-27 DIAGNOSIS — R4689 Other symptoms and signs involving appearance and behavior: Secondary | ICD-10-CM | POA: Diagnosis not present

## 2018-12-27 DIAGNOSIS — M791 Myalgia, unspecified site: Secondary | ICD-10-CM | POA: Diagnosis not present

## 2018-12-27 DIAGNOSIS — F430 Acute stress reaction: Secondary | ICD-10-CM

## 2018-12-27 NOTE — Progress Notes (Signed)
HPI:                                                                Karen MadridBrynn Stokes is a 33 y.o. female who [facility] [place] today for follow-up  Patient was seen by my colleague Tandy GawJade Breeback PA-C on 12/20/18 for left leg pain/burning sensation. A1C showed poor glycemic control (>12) and patient admitted she was not compliant with any of her medications. She was switched to ComorosFarxiga and Ozempic and has been taking these. X-ray of her lumbar spine was ordered, but patient did not have this performed. She was also treated for possible early Shingles with antiviral and prednisone.  She was then seen by my colleague, Dr. Clementeen GrahamEvan Corey on 12/23/18 for dizziness/lightheadedness/vomiting. Per Dr. Zollie Peeorey's note: "[name] is going through a very challenging emotional time.  Her daughter was just admitted to the hospital for suicidal ideation about a day ago.  During her daughter's intake she developed vomiting and then became lightheaded and dizzy. [name] was assessed at [facility] ED yesterday evening.  She had nurse intake EKG and initial lab assessment.  EKG not available in EMR at time of visit.  Per report heart rate was elevated.  Chest x-ray was normal per radiology report.  Labs were significant for findings consistent with dehydration with mildly elevated hemoglobin.  Blood sugar was elevated at 270s.  Anion gap was elevated at 17.However she was unable to stay in the emergency department and did not complete her evaluation."  Today she endorses fatigue, loss of appetite, dry mouth, thirst, and dyspnea on mild exertion, muscle soreness in her lower legs from knee down She reports she is still under a lot of stress related to her daughter's psychiatric hospitalization. She endorses daily panic attacks. She has taken Clonazepam on 2 occasions over the last 4 days.  Has not had any additional vomiting since 12/22/18 She is drinking 6-7 bottles of  water daily  Depression screen [facility] 2/9 12/27/2018 05/09/2018 02/14/2018 02/14/2018 02/01/2018  Decreased Interest 1 2 1 1 3   Down, Depressed, Hopeless 2 2 2 2 3   PHQ - 2 Score 3 4 3 3 6   Altered sleeping 2 3 1 1 3   Tired, decreased energy 3 3 1 1 3   Change in appetite 3 3 2 2 3   Feeling bad or failure about yourself  3 3 2 2 3   Trouble concentrating 3 2 0 0 3  Moving slowly or fidgety/restless 3 3 2 2 3   Suicidal thoughts 0 0 0 0 0  PHQ-9 Score 20 21 11 11 24   Difficult doing work/chores - Extremely dIfficult Somewhat difficult Somewhat difficult Extremely dIfficult    GAD 7 : Generalized Anxiety Score 12/27/2018 05/09/2018 02/14/2018 02/14/2018  Nervous, Anxious, on Edge 3 3 2 2   Control/stop worrying 3 3 2 2   Worry too much - different things 3 3 2 2   Trouble relaxing 3 2 2 2   Restless 3 2 1 1   Easily annoyed or irritable 0 3 1 1   Afraid - awful might happen 3 3 3 3   Total GAD 7 Score 18 19 13 13   Anxiety Difficulty - Extremely difficult Somewhat difficult Somewhat difficult      Past Medical  History:  Diagnosis Date  . Allergy   . Anxiety   . Depression   . Eosinophilic esophagitis   . GERD (gastroesophageal reflux disease)   . Hypertension   . Irritable bowel syndrome   . Obesity    Past Surgical History:  Procedure Laterality Date  . CHOLECYSTECTOMY    . ESOPHAGEAL DILATION    . TONSILLECTOMY     Social History   Tobacco Use  . Smoking status: Never Smoker  . Smokeless tobacco: Never Used  Substance Use Topics  . Alcohol use: Never    Frequency: Never   family history includes Heart attack in her maternal grandfather; Hypertension in her maternal grandfather; Stroke in her maternal grandfather.    ROS: negative except as noted in the HPI  Medications: Current Outpatient Medications  Medication Sig Dispense Refill  . clonazePAM (KLONOPIN) 0.5 MG tablet Take 1 tablet (0.5 mg total) by mouth 2 (two) times daily as needed for anxiety. 30 tablet 1  .  dapagliflozin propanediol (FARXIGA) 10 MG TABS tablet Take 10 mg by mouth daily. 30 tablet 2  . gabapentin (NEURONTIN) 100 MG capsule Take 1 capsule (100 mg total) by mouth 3 (three) times daily. 90 capsule 0  . levonorgestrel (MIRENA) 20 MCG/24HR IUD 1 each by Intrauterine route once.    Marland Kitchen omeprazole (PRILOSEC) 20 MG capsule Take 1 capsule by mouth daily.    . Semaglutide,0.25 or 0.5MG /DOS, (OZEMPIC, 0.25 OR 0.5 MG/DOSE,) 2 MG/1.5ML SOPN Inject 0.25 mg into the skin once a week. 1 pen 2  . valACYclovir (VALTREX) 1000 MG tablet Take 1 tablet (1,000 mg total) by mouth 3 (three) times daily. For 7 days. 21 tablet 0  . valsartan-hydrochlorothiazide (DIOVAN-HCT) 160-25 MG tablet Take 1 tablet by mouth daily. 90 tablet 1  . FLUoxetine (PROZAC) 10 MG capsule TAKE 1 CAPSULE (10 MG TOTAL) BY MOUTH AT BEDTIME FOR 3 DAYS, THEN 2 CAPSULES (20 MG TOTAL) AT BEDTIME FOR 27 DAYS. 180 capsule 1   No current facility-administered medications for this visit.    Allergies  Allergen Reactions  . Amoxicillin-Pot Clavulanate Nausea And Vomiting  . Morphine And Related Other (See Comments)    headaches   . Trileptal [Oxcarbazepine] Other (See Comments)    hallucinations       Objective:  BP 109/76   Pulse 99   Temp 98.1 F (36.7 C) (Oral)   Wt (!) 363 lb (164.7 kg)   BMI 60.41 kg/m   Wt Readings from Last 3 Encounters:  12/27/18 (!) 363 lb (164.7 kg)  12/23/18 (!) 361 lb (163.7 kg)  12/20/18 (!) 375 lb (170.1 kg)   Temp Readings from Last 3 Encounters:  12/27/18 98.1 F (36.7 C) (Oral)  12/23/18 98.4 F (36.9 C) (Oral)  12/20/18 99.2 F (37.3 C) (Oral)   BP Readings from Last 3 Encounters:  12/27/18 109/76  12/23/18 130/78  12/20/18 (!) 165/111   Pulse Readings from Last 3 Encounters:  12/27/18 99  12/23/18 62  12/20/18 100    Gen:  alert, not ill-appearing, no distress, appropriate for age 13: head normocephalic without obvious abnormality, conjunctiva and cornea clear, trachea  midline Pulm: Normal work of breathing, normal phonation, clear to auscultation bilaterally, no wheezes, rales or rhonchi CV: Normal rate, regular rhythm, s1 and s2 distinct, no murmurs, clicks or rubs  Neuro: alert and oriented x 3, no tremor MSK: extremities atraumatic, normal gait and station Skin: intact, no rashes on exposed skin, no jaundice, no cyanosis Psych: well-groomed,  cooperative, good eye contact, depressed mood, affect mood-congruent, speech is articulate, and thought processes clear and goal-directed  Recent Results (from the past 2160 hour(s))  POCT glycosylated hemoglobin (Hb A1C)     Status: Abnormal   Collection Time: 12/20/18 11:44 AM  Result Value Ref Range   Hemoglobin A1C 12.1 (A) 4.0 - 5.6 %   HbA1c POC (<> result, manual entry)     HbA1c, POC (prediabetic range)     HbA1c, POC (controlled diabetic range)     Lab Results  Component Value Date   CREATININE 0.79 02/03/2018   BUN 12 02/03/2018   NA 136 02/03/2018   K 4.1 02/03/2018   CL 103 02/03/2018   CO2 25 02/03/2018   Lab Results  Component Value Date   ALT 23 02/03/2018   AST 13 02/03/2018   BILITOT 0.5 02/03/2018   Lab Results  Component Value Date   WBC 9.2 02/03/2018   HGB 12.8 02/03/2018   HCT 38.2 02/03/2018   MCV 85.5 02/03/2018   PLT 242 02/03/2018   Lab Results  Component Value Date   CHOL 219 (H) 02/03/2018   HDL 37 (L) 02/03/2018   LDLCALC 143 (H) 02/03/2018   TRIG 253 (H) 02/03/2018   CHOLHDL 5.9 (H) 02/03/2018     Assessment and Plan: 33 y.o. female with   .Marylin was seen today for follow-up.  Diagnoses and all orders for this visit:  Elevated ferritin, hemoglobin, and red blood cell count (HCC) -     CBC -     Lactate Dehydrogenase (LDH) -     Fe+TIBC+Fer -     CK -     Sedimentation rate -     C-reactive protein  Acute stress reaction -     Ambulatory referral to Psychology  Compulsive behaviors -     Ambulatory referral to Psychology  GAD (generalized  anxiety disorder) -     Ambulatory referral to Psychology  Hx of metabolic acidosis with increased anion gap -     COMPLETE METABOLIC PANEL WITH GFR  Uncontrolled type 2 diabetes mellitus with hyperglycemia (HCC) -     Ambulatory referral to diabetic education  Myalgia -     CK -     Sedimentation rate -     C-reactive protein -     TSH + free T4  Palpitations -     TSH + free T4       Patient education and anticipatory guidance given Patient agrees with treatment plan Follow-up in 3 weeks with PA Breeback or sooner as needed if symptoms worsen or fail to improve  Levonne Hubert PA-C

## 2018-12-27 NOTE — Patient Instructions (Addendum)
Hold Karen Stokes for now until you hear from me

## 2018-12-27 NOTE — Progress Notes (Signed)
Lumbar spine. Alignment is normal. Disc space is normal. Lumbar spine looks good.

## 2018-12-28 LAB — CBC
HCT: 43.7 % (ref 35.0–45.0)
Hemoglobin: 14.7 g/dL (ref 11.7–15.5)
MCH: 28.7 pg (ref 27.0–33.0)
MCHC: 33.6 g/dL (ref 32.0–36.0)
MCV: 85.2 fL (ref 80.0–100.0)
MPV: 12 fL (ref 7.5–12.5)
Platelets: 278 10*3/uL (ref 140–400)
RBC: 5.13 10*6/uL — ABNORMAL HIGH (ref 3.80–5.10)
RDW: 12.4 % (ref 11.0–15.0)
WBC: 7.3 10*3/uL (ref 3.8–10.8)

## 2018-12-28 LAB — TSH+FREE T4: TSH W/REFLEX TO FT4: 1.46 mIU/L

## 2018-12-28 LAB — IRON,TIBC AND FERRITIN PANEL
%SAT: 26 % (calc) (ref 16–45)
Ferritin: 150 ng/mL (ref 16–154)
Iron: 99 ug/dL (ref 40–190)
TIBC: 383 mcg/dL (calc) (ref 250–450)

## 2018-12-28 LAB — CK: Total CK: 41 U/L (ref 29–143)

## 2018-12-28 LAB — COMPLETE METABOLIC PANEL WITH GFR
AG Ratio: 1.6 (calc) (ref 1.0–2.5)
ALT: 39 U/L — ABNORMAL HIGH (ref 6–29)
AST: 32 U/L — ABNORMAL HIGH (ref 10–30)
Albumin: 4.2 g/dL (ref 3.6–5.1)
Alkaline phosphatase (APISO): 109 U/L (ref 31–125)
BUN: 7 mg/dL (ref 7–25)
CO2: 22 mmol/L (ref 20–32)
Calcium: 9.3 mg/dL (ref 8.6–10.2)
Chloride: 100 mmol/L (ref 98–110)
Creat: 0.8 mg/dL (ref 0.50–1.10)
GFR, Est African American: 113 mL/min/{1.73_m2} (ref >=60)
GFR, Est Non African American: 98 mL/min/{1.73_m2} (ref >=60)
Globulin: 2.7 g/dL (calc) (ref 1.9–3.7)
Glucose, Bld: 232 mg/dL — ABNORMAL HIGH (ref 65–99)
Potassium: 3.7 mmol/L (ref 3.5–5.3)
Sodium: 135 mmol/L (ref 135–146)
Total Bilirubin: 0.6 mg/dL (ref 0.2–1.2)
Total Protein: 6.9 g/dL (ref 6.1–8.1)

## 2018-12-28 LAB — C-REACTIVE PROTEIN: CRP: 6.8 mg/L (ref <8.0)

## 2018-12-28 LAB — SEDIMENTATION RATE: Sed Rate: 9 mm/h (ref 0–20)

## 2018-12-28 LAB — LACTATE DEHYDROGENASE: LDH: 167 U/L (ref 100–200)

## 2018-12-29 ENCOUNTER — Encounter: Payer: Self-pay | Admitting: Physician Assistant

## 2019-01-03 ENCOUNTER — Ambulatory Visit: Payer: Self-pay | Admitting: Physician Assistant

## 2019-01-17 ENCOUNTER — Encounter: Payer: BC Managed Care – PPO | Attending: Physician Assistant

## 2019-01-24 ENCOUNTER — Ambulatory Visit: Payer: BC Managed Care – PPO

## 2019-01-24 ENCOUNTER — Ambulatory Visit (INDEPENDENT_AMBULATORY_CARE_PROVIDER_SITE_OTHER): Payer: BC Managed Care – PPO | Admitting: Physician Assistant

## 2019-01-24 ENCOUNTER — Encounter: Payer: Self-pay | Admitting: Physician Assistant

## 2019-01-24 ENCOUNTER — Other Ambulatory Visit: Payer: Self-pay

## 2019-01-24 VITALS — BP 138/92 | HR 118 | Ht 64.0 in | Wt 356.0 lb

## 2019-01-24 DIAGNOSIS — F332 Major depressive disorder, recurrent severe without psychotic features: Secondary | ICD-10-CM

## 2019-01-24 DIAGNOSIS — I10 Essential (primary) hypertension: Secondary | ICD-10-CM

## 2019-01-24 DIAGNOSIS — F411 Generalized anxiety disorder: Secondary | ICD-10-CM

## 2019-01-24 DIAGNOSIS — E1165 Type 2 diabetes mellitus with hyperglycemia: Secondary | ICD-10-CM | POA: Diagnosis not present

## 2019-01-24 DIAGNOSIS — R4689 Other symptoms and signs involving appearance and behavior: Secondary | ICD-10-CM | POA: Diagnosis not present

## 2019-01-24 DIAGNOSIS — E1159 Type 2 diabetes mellitus with other circulatory complications: Secondary | ICD-10-CM | POA: Diagnosis not present

## 2019-01-24 DIAGNOSIS — I152 Hypertension secondary to endocrine disorders: Secondary | ICD-10-CM

## 2019-01-24 MED ORDER — VALSARTAN-HYDROCHLOROTHIAZIDE 160-25 MG PO TABS: 1.0000 | ORAL_TABLET | Freq: Every day | ORAL | 1 refills | Status: DC

## 2019-01-24 NOTE — Progress Notes (Signed)
Subjective:     Patient ID: Karen Stokes, female   DOB: 05-Mar-1986, 33 y.o.   MRN: [id number]  HPI  Pt is a 33 yo obese female with uncontrolled T2DM, HTN, MDD, GAD who presents to the clinic for follow up.   Her sugars were found to be out of control in September. She then had episode of dehydration. She then had some abnormal blood work and feelings of stress, anxiety, depression.   She has started taking her medication and feeling much better. She is losing weight. She feels that her cravings are much better. He glucometer broke but she is getting a new one. No open sores or wounds. She is tolerating ozempic great.   Did not take BP medication today. Denies any CP, palpitations, headaches, vision changes.   Her mood is much better. Her anxiety and depression better. Her daughter is living with mother and her daughter is getting the help she needs.   .. Active Ambulatory Problems    Diagnosis Date Noted  . Eosinophilic esophagitis   . Irritable bowel syndrome   . Palpitations 02/01/2018  . Class 3 severe obesity due to excess calories with body mass index (BMI) of 60.0 to 69.9 in adult ([place]) 02/01/2018  . Uncontrolled stage 2 hypertension 02/01/2018  . Bilateral low back pain without sciatica 02/01/2018  . Dyspnea on exertion 02/01/2018  . GAD (generalized anxiety disorder) 02/01/2018  . Severe episode of recurrent major depressive disorder, without psychotic features ([place]) 02/01/2018  . Uncontrolled type 2 diabetes mellitus with hyperglycemia ([place]) 02/13/2018  . Intractable migraine without aura and without status migrainosus 03/17/2018  . Encounter for routine checking of intrauterine contraceptive device (IUD) 03/17/2018  . Hyperlipidemia associated with type 2 diabetes mellitus ([place]) 05/09/2018  . Hypertension associated with diabetes ([place]) 05/09/2018  . Compulsive behaviors 05/09/2018  . Diabetic eye exam ([place]) 05/09/2018  . History of shingles 12/21/2018   Resolved  Ambulatory Problems    Diagnosis Date Noted  . No Resolved Ambulatory Problems   Past Medical History:  Diagnosis Date  . Allergy   . Anxiety   . Depression   . GERD (gastroesophageal reflux disease)   . Hypertension   . Obesity      Review of Systems  All other systems reviewed and are negative.      Objective:   Physical Exam Vitals signs reviewed.  Constitutional:      Appearance: Normal appearance. She is obese.  Cardiovascular:     Rate and Rhythm: Regular rhythm. Tachycardia present.  Pulmonary:     Effort: Pulmonary effort is normal.     Breath sounds: Normal breath sounds.  Neurological:     General: No focal deficit present.     Mental Status: She is alert and oriented to person, place, and time.  Psychiatric:        Mood and Affect: Mood normal.      .. Depression screen [facility] 2/9 01/24/2019 12/27/2018 05/09/2018 02/14/2018 02/14/2018  Decreased Interest 1 1 2 1 1   Down, Depressed, Hopeless 1 2 2 2 2   PHQ - 2 Score 2 3 4 3 3   Altered sleeping 0 2 3 1 1   Tired, decreased energy 0 3 3 1 1   Change in appetite 1 3 3 2 2   Feeling bad or failure about yourself  0 3 3 2 2   Trouble concentrating 0 3 2 0 0  Moving slowly or fidgety/restless 0 3 3 2 2   Suicidal thoughts 0 0 0 0  0  PHQ-9 Score 3 20 21 11 11   Difficult doing work/chores Somewhat difficult - Extremely dIfficult Somewhat difficult Somewhat difficult   . GAD 7 : Generalized Anxiety Score 01/24/2019 12/27/2018 05/09/2018 02/14/2018  Nervous, Anxious, on Edge 1 3 3 2   Control/stop worrying 0 3 3 2   Worry too much - different things 0 3 3 2   Trouble relaxing 0 3 2 2   Restless 0 3 2 1   Easily annoyed or irritable 0 0 3 1  Afraid - awful might happen 0 3 3 3   Total GAD 7 Score 1 18 19 13   Anxiety Difficulty Not difficult at all - Extremely difficult Somewhat difficult     Assessment:     13/07/2017 Blaine was seen today for follow-up.  Diagnoses and all orders for this visit:  Uncontrolled type 2 diabetes  mellitus with hyperglycemia (HCC)  Hypertension associated with diabetes (HCC) -     valsartan-hydrochlorothiazide (DIOVAN-HCT) 160-25 MG tablet; Take 1 tablet by mouth daily.  Compulsive behaviors  GAD (generalized anxiety disorder)  Severe episode of recurrent major depressive disorder, without psychotic features (HCC)       Plan:     Pt admits she did not take BP medication this morning. Refilled. Discuss importance of taking. She is not checking BP at home.   Too soon for a1c. Pt is doing much better with taking medications. Last random glucose in blood work was in the 100's. Discussed increasing ozempic to .5mg  weekly for more A!C and weight loss benefit.   Follow up in 2 months.

## 2019-01-27 ENCOUNTER — Encounter: Payer: Self-pay | Admitting: Physician Assistant

## 2019-01-31 ENCOUNTER — Ambulatory Visit: Payer: BC Managed Care – PPO

## 2019-02-02 ENCOUNTER — Ambulatory Visit (INDEPENDENT_AMBULATORY_CARE_PROVIDER_SITE_OTHER): Payer: BC Managed Care – PPO | Admitting: Psychology

## 2019-02-02 DIAGNOSIS — F331 Major depressive disorder, recurrent, moderate: Secondary | ICD-10-CM

## 2019-02-02 DIAGNOSIS — F411 Generalized anxiety disorder: Secondary | ICD-10-CM | POA: Diagnosis not present

## 2019-02-23 ENCOUNTER — Ambulatory Visit (INDEPENDENT_AMBULATORY_CARE_PROVIDER_SITE_OTHER): Payer: BC Managed Care – PPO | Admitting: Psychology

## 2019-02-23 DIAGNOSIS — F411 Generalized anxiety disorder: Secondary | ICD-10-CM

## 2019-02-23 DIAGNOSIS — F331 Major depressive disorder, recurrent, moderate: Secondary | ICD-10-CM

## 2019-02-24 ENCOUNTER — Other Ambulatory Visit: Payer: Self-pay | Admitting: Physician Assistant

## 2019-02-24 DIAGNOSIS — E1165 Type 2 diabetes mellitus with hyperglycemia: Secondary | ICD-10-CM

## 2019-02-24 MED ORDER — DAPAGLIFLOZIN PROPANEDIOL 10 MG PO TABS: 10.0000 mg | ORAL_TABLET | Freq: Every day | ORAL | 0 refills | Status: DC

## 2019-02-24 NOTE — Telephone Encounter (Signed)
Received 4 boxes of Ozempic for this patient.  Lot: [id number] Expiration: 03/12/2021.  Patient will have spouse pick up.

## 2019-03-02 ENCOUNTER — Other Ambulatory Visit: Payer: Self-pay

## 2019-03-02 ENCOUNTER — Encounter: Payer: Self-pay | Admitting: Family Medicine

## 2019-03-02 ENCOUNTER — Ambulatory Visit (INDEPENDENT_AMBULATORY_CARE_PROVIDER_SITE_OTHER): Payer: BC Managed Care – PPO | Admitting: Family Medicine

## 2019-03-02 VITALS — BP 99/56 | HR 95 | Ht 64.0 in | Wt 339.0 lb

## 2019-03-02 DIAGNOSIS — H9202 Otalgia, left ear: Secondary | ICD-10-CM | POA: Diagnosis not present

## 2019-03-02 MED ORDER — PREDNISONE 20 MG PO TABS: 40.0000 mg | ORAL_TABLET | Freq: Every day | ORAL | 0 refills | Status: DC

## 2019-03-02 NOTE — Progress Notes (Signed)
Acute Office Visit  Subjective:    Patient ID: Karen Stokes, female    DOB: [birth year], 33 y.o.   MRN: [id number]  No chief complaint on file.   HPI Patient is in today for Left ear pain.  She says been there for about a week.  She has not had any upper respiratory symptoms or fever or chills.  No drainage from the ear.  She says the pain kind of radiates behind her ear and then up into her temple.  Sometimes is aggravated with chewing she will get a sharp shooting pain at times.  But it does not always bother her when she is eating.  No associated headaches.  She is a fairly new start on Ozempic and so was also just worried about potential for side effects.  She is actually been doing really well and has already lost some weight.  Is not been able to wear her headphones at work because it so tender.   Past Medical History:  Diagnosis Date  . Allergy   . Anxiety   . Depression   . Eosinophilic esophagitis   . GERD (gastroesophageal reflux disease)   . Hypertension   . Irritable bowel syndrome   . Obesity     Past Surgical History:  Procedure Laterality Date  . CHOLECYSTECTOMY    . ESOPHAGEAL DILATION    . TONSILLECTOMY      Family History  Problem Relation Age of Onset  . Hypertension Maternal Grandfather   . Heart attack Maternal Grandfather   . Stroke Maternal Grandfather     Social History   Socioeconomic History  . Marital status: Married    Spouse name: Not on file  . Number of children: Not on file  . Years of education: Not on file  . Highest education level: Not on file  Occupational History  . Not on file  Social Needs  . Financial resource strain: Not on file  . Food insecurity    Worry: Not on file    Inability: Not on file  . Transportation needs    Medical: Not on file    Non-medical: Not on file  Tobacco Use  . Smoking status: Never Smoker  . Smokeless tobacco: Never Used  Substance and Sexual Activity  . Alcohol use: Never   Frequency: Never  . Drug use: Never  . Sexual activity: Yes    Birth control/protection: I.U.D.  Lifestyle  . Physical activity    Days per week: Not on file    Minutes per session: Not on file  . Stress: Not on file  Relationships  . Social Herbalist on phone: Not on file    Gets together: Not on file    Attends religious service: Not on file    Active member of club or organization: Not on file    Attends meetings of clubs or organizations: Not on file    Relationship status: Not on file  . Intimate partner violence    Fear of current or ex partner: Not on file    Emotionally abused: Not on file    Physically abused: Not on file    Forced sexual activity: Not on file  Other Topics Concern  . Not on file  Social History Narrative  . Not on file    Outpatient Medications Prior to Visit  Medication Sig Dispense Refill  . clonazePAM (KLONOPIN) 0.5 MG tablet Take 1 tablet (0.5 mg total) by mouth 2 (two) times  daily as needed for anxiety. 30 tablet 1  . dapagliflozin propanediol (FARXIGA) 10 MG TABS tablet Take 10 mg by mouth daily. 30 tablet 0  . levonorgestrel (MIRENA) 20 MCG/24HR IUD 1 each by Intrauterine route once.    Marland Kitchen. omeprazole (PRILOSEC) 20 MG capsule Take 1 capsule by mouth daily.    . Semaglutide,0.25 or 0.5MG /DOS, (OZEMPIC, 0.25 OR 0.5 MG/DOSE,) 2 MG/1.5ML SOPN Inject 0.25 mg into the skin once a week. 1 pen 2  . valsartan-hydrochlorothiazide (DIOVAN-HCT) 160-25 MG tablet Take 1 tablet by mouth daily. 90 tablet 1  . FLUoxetine (PROZAC) 10 MG capsule TAKE 1 CAPSULE (10 MG TOTAL) BY MOUTH AT BEDTIME FOR 3 DAYS, THEN 2 CAPSULES (20 MG TOTAL) AT BEDTIME FOR 27 DAYS. 180 capsule 1  . metFORMIN (GLUCOPHAGE) 500 MG tablet TAKE 1 TABLET TWICE A DAY WITH FOOD 60 tablet 1   No facility-administered medications prior to visit.     Allergies  Allergen Reactions  . Amoxicillin-Pot Clavulanate Nausea And Vomiting  . Metformin And Related Diarrhea    Severe GI upset   . Morphine And Related Other (See Comments)    headaches   . Trileptal [Oxcarbazepine] Other (See Comments)    hallucinations    ROS     Objective:    Physical Exam  Constitutional: She is oriented to person, place, and time. She appears well-developed and well-nourished.  HENT:  Head: Normocephalic and atraumatic.  Right Ear: External ear normal.  Left Ear: External ear normal.  Nose: Nose normal.  TMs and canals are clear.  Nontender over the TMJ joint.  She is a little tender right behind the ear.  No popping or clicking with opening or closing of the jaw.  Eyes: Pupils are equal, round, and reactive to light. Conjunctivae and EOM are normal.  Neck: Neck supple. No thyromegaly present.  Cardiovascular: Normal rate, regular rhythm and normal heart sounds.  Pulmonary/Chest: Effort normal and breath sounds normal. She has no wheezes.  Lymphadenopathy:    She has no cervical adenopathy.  Neurological: She is alert and oriented to person, place, and time.  Skin: Skin is warm and dry.  Psychiatric: She has a normal mood and affect.    BP (!) 99/56   Pulse 95   Ht 5\' 4"  (1.626 m)   Wt (!) 339 lb (153.8 kg)   SpO2 98%   BMI 58.19 kg/m  Wt Readings from Last 3 Encounters:  03/02/19 (!) 339 lb (153.8 kg)  01/24/19 (!) 356 lb (161.5 kg)  12/27/18 (!) 363 lb (164.7 kg)    Health Maintenance Due  Topic Date Due  . OPHTHALMOLOGY EXAM  04/17/1996    There are no preventive care reminders to display for this patient.   No results found for: TSH Lab Results  Component Value Date   WBC 7.3 12/27/2018   HGB 14.7 12/27/2018   HCT 43.7 12/27/2018   MCV 85.2 12/27/2018   PLT 278 12/27/2018   Lab Results  Component Value Date   NA 135 12/27/2018   K 3.7 12/27/2018   CO2 22 12/27/2018   GLUCOSE 232 (H) 12/27/2018   BUN 7 12/27/2018   CREATININE 0.80 12/27/2018   BILITOT 0.6 12/27/2018   AST 32 (H) 12/27/2018   ALT 39 (H) 12/27/2018   PROT 6.9 12/27/2018   CALCIUM  9.3 12/27/2018   Lab Results  Component Value Date   CHOL 219 (H) 02/03/2018   Lab Results  Component Value Date   HDL 37 (  L) 02/03/2018   Lab Results  Component Value Date   LDLCALC 143 (H) 02/03/2018   Lab Results  Component Value Date   TRIG 253 (H) 02/03/2018   Lab Results  Component Value Date   CHOLHDL 5.9 (H) 02/03/2018   Lab Results  Component Value Date   HGBA1C 12.1 (A) 12/20/2018       Assessment & Plan:   Problem List Items Addressed This Visit    None    Visit Diagnoses    Left ear pain    -  Primary     Left ear pain-exam is essentially normal.  No tenderness over the TMJ joint and no popping or clicking with opening and closing.  At this point we discussed maybe a round of prednisone to help with inflammation since it is quite tender.  Right now I do not see any sign of infection so I did not recommend an antibiotic.  Certainly if anything changes please let us know.  Or if the prednisone is not helping.  So consider that she could have TMJ.  So just encouraged her to avoid excessive chewing.  Meds ordered this encounter  Medications  . predniSONE (DELTASONE) 20 MG tablet    Sig: Take 2 tablets (40 mg total) by mouth daily with breakfast.    Dispense:  10 tablet    Refill:  0     Nani Gasser, MD

## 2019-03-08 ENCOUNTER — Ambulatory Visit: Payer: BC Managed Care – PPO | Admitting: Psychology

## 2019-03-14 NOTE — Progress Notes (Signed)
Karen Stokes had signed for this patient and they have been shipped in the office.   [place]: [id number] Lot: [id number] Expiration: 08/10/2021. 5 boxes will be given to patient and front is aware.

## 2019-03-21 ENCOUNTER — Ambulatory Visit (INDEPENDENT_AMBULATORY_CARE_PROVIDER_SITE_OTHER): Payer: BC Managed Care – PPO | Admitting: Psychology

## 2019-03-21 DIAGNOSIS — F331 Major depressive disorder, recurrent, moderate: Secondary | ICD-10-CM

## 2019-03-21 DIAGNOSIS — F411 Generalized anxiety disorder: Secondary | ICD-10-CM

## 2019-03-28 ENCOUNTER — Ambulatory Visit (INDEPENDENT_AMBULATORY_CARE_PROVIDER_SITE_OTHER): Payer: BC Managed Care – PPO | Admitting: Physician Assistant

## 2019-03-28 ENCOUNTER — Encounter: Payer: Self-pay | Admitting: Physician Assistant

## 2019-03-28 ENCOUNTER — Other Ambulatory Visit: Payer: Self-pay

## 2019-03-28 VITALS — BP 138/79 | HR 105 | Ht 64.0 in | Wt 334.1 lb

## 2019-03-28 DIAGNOSIS — S29012A Strain of muscle and tendon of back wall of thorax, initial encounter: Secondary | ICD-10-CM | POA: Insufficient documentation

## 2019-03-28 DIAGNOSIS — M549 Dorsalgia, unspecified: Secondary | ICD-10-CM | POA: Diagnosis not present

## 2019-03-28 DIAGNOSIS — E118 Type 2 diabetes mellitus with unspecified complications: Secondary | ICD-10-CM | POA: Diagnosis not present

## 2019-03-28 LAB — POCT GLYCOSYLATED HEMOGLOBIN (HGB A1C): Hemoglobin A1C: 6.7 % — AB (ref 4.0–5.6)

## 2019-03-28 MED ORDER — DAPAGLIFLOZIN PROPANEDIOL 10 MG PO TABS: 10.0000 mg | ORAL_TABLET | Freq: Every day | ORAL | 2 refills | Status: DC

## 2019-03-28 MED ORDER — KETOROLAC TROMETHAMINE 60 MG/2ML IM SOLN
60.0000 mg | Freq: Once | INTRAMUSCULAR | Status: AC
  Administered 2019-03-28: 60 mg via INTRAMUSCULAR

## 2019-03-28 MED ORDER — CYCLOBENZAPRINE HCL 10 MG PO TABS: 10.0000 mg | ORAL_TABLET | Freq: Three times a day (TID) | ORAL | 0 refills | Status: DC | PRN

## 2019-03-28 MED ORDER — OZEMPIC (0.25 OR 0.5 MG/DOSE) 2 MG/1.5ML ~~LOC~~ SOPN: 0.2500 mg | PEN_INJECTOR | SUBCUTANEOUS | 2 refills | Status: DC

## 2019-03-28 NOTE — Progress Notes (Signed)
Subjective:    Patient ID: Karen Stokes, female    DOB: Apr 19, 1985, 33 y.o.   MRN: [id number]  HPI  Pt is a 33 yo morbidly obese female with T2DM, HTN, MDD, GAD who presents to the clinic for 3 month follow up.   3 months ago her A1C was 12.2. she started ozempic and farxgia and doing great. She denies any side effects. Her GI symptoms of IBS have actually improved. She is watching her sugars/carbs/portions. She is trying to stay active. No hypoglycemic events. No open sores or wounds.   She does mention some left mid back pain that started 1 month ago. It feels similar to when she pulled a muscle after carrying groceries a few month ago. She denies any new injury. It is worse with bending head forward and reaching with left arm. Rates pain 1-5/10. At times pain radiates around to left breast. Ibuprofen does help. No epigastric or upper abdominal pain. No nausea or vomiting. No urinary symptoms or flank pain.   .. Active Ambulatory Problems    Diagnosis Date Noted  . Eosinophilic esophagitis   . Irritable bowel syndrome   . Palpitations 02/01/2018  . Class 3 severe obesity due to excess calories with body mass index (BMI) of 60.0 to 69.9 in adult (HCC) 02/01/2018  . Uncontrolled stage 2 hypertension 02/01/2018  . Bilateral low back pain without sciatica 02/01/2018  . Dyspnea on exertion 02/01/2018  . GAD (generalized anxiety disorder) 02/01/2018  . Severe episode of recurrent major depressive disorder, without psychotic features (HCC) 02/01/2018  . Uncontrolled type 2 diabetes mellitus with hyperglycemia (HCC) 02/13/2018  . Intractable migraine without aura and without status migrainosus 03/17/2018  . Encounter for routine checking of intrauterine contraceptive device (IUD) 03/17/2018  . Hyperlipidemia associated with type 2 diabetes mellitus (HCC) 05/09/2018  . Hypertension associated with diabetes (HCC) 05/09/2018  . Compulsive behaviors 05/09/2018  . Diabetic eye exam (HCC)  05/09/2018  . History of shingles 12/21/2018  . Strain of rhomboid muscle 03/28/2019   Resolved Ambulatory Problems    Diagnosis Date Noted  . No Resolved Ambulatory Problems   Past Medical History:  Diagnosis Date  . Allergy   . Anxiety   . Depression   . GERD (gastroesophageal reflux disease)   . Hypertension   . Obesity      Review of Systems See HPI.     Objective:   Physical Exam Vitals reviewed.  Constitutional:      Appearance: Normal appearance. She is obese.  HENT:     Head: Normocephalic.     Right Ear: Tympanic membrane normal.     Left Ear: Tympanic membrane normal.  Cardiovascular:     Rate and Rhythm: Normal rate and regular rhythm.     Pulses: Normal pulses.  Pulmonary:     Effort: Pulmonary effort is normal.     Breath sounds: Normal breath sounds.  Abdominal:     General: There is no distension.     Tenderness: There is no abdominal tenderness. There is no right CVA tenderness or left CVA tenderness.  Musculoskeletal:     Comments: Pain pinpoint over mid left back over rhomboid muscle. No pain over spine to palpation.  Left arm ROM causes pain in mid left back.    Neurological:     General: No focal deficit present.     Mental Status: She is alert and oriented to person, place, and time.  Psychiatric:        Mood  and Affect: Mood normal.           Assessment & Plan:  Marland KitchenMarland KitchenJeannett was seen today for diabetes and back pain.  Diagnoses and all orders for this visit:  Controlled type 2 diabetes mellitus with complication, without long-term current use of insulin (HCC) -     POCT HgB A1C -     Semaglutide,0.25 or 0.5MG /DOS, (OZEMPIC, 0.25 OR 0.5 MG/DOSE,) 2 MG/1.5ML SOPN; Inject 0.25 mg into the skin once a week. -     dapagliflozin propanediol (FARXIGA) 10 MG TABS tablet; Take 10 mg by mouth daily. -     Ambulatory referral to Ophthalmology  Strain of rhomboid muscle, initial encounter -     cyclobenzaprine (FLEXERIL) 10 MG tablet; Take 1  tablet (10 mg total) by mouth 3 (three) times daily as needed for muscle spasms. -     ketorolac (TORADOL) injection 60 mg  Mid back pain on left side -     Lipase -     COMPLETE METABOLIC PANEL WITH GFR -     cyclobenzaprine (FLEXERIL) 10 MG tablet; Take 1 tablet (10 mg total) by mouth 3 (three) times daily as needed for muscle spasms.   Lab Results  Component Value Date   HGBA1C 6.7 (A) 03/28/2019   A1c is fabulous down from 12.2 just 3 months ago .  Continue healthy diet and lifestyle changes Continue Ozempic and Farxiga Continue ARB.  Blood pressure almost to goal with today's reading 138/79.  No changes made today.  Hopefully with more weight loss blood pressure will continue to decrease. Patient is not currently on a statin.  We will consider this therapy in the near future.  Right now she wants to maximize her current medications and lifestyle changes. Patient is in need of an eye exam referral was made today. Patient is up-to-date on her flu and pneumonia vaccine.  Follow up 3 months.   Patient is doing amazing with weight loss.  I do think that Ozempic is helping her out.  She is down from 380 from the summer to 334 today.  Patient is having some left mid back pain.  I am able to palpate the area of pain.  I do think this represents a musculoskeletal strain of the rhomboid and some tightness.  Since we did add Ozempic about 3 months ago I do want to make sure there is no pancreas inflammation.  She did report the pain radiating into her left breast and upper abdomen.  Will check CMP and lipase.  For now continue Ozempic.  Toradol IM was given in office today.  Continue using as needed ibuprofen.  Suggested massage, icy hot patches, TENS unit and exercises.  Patient was given a handout of rhomboid exercises.  Follow-up as needed or if symptoms worsen or persist.  ..Spent 30 minutes with patient and greater than 50 percent of visit spent counseling patient regarding treatment  plan.

## 2019-03-28 NOTE — Patient Instructions (Addendum)
Referral was placed to [facility]. You can contact them at [phone] to schedule an appointment.  Get labs.  Start exercises.  Ibuprofen as needed. Consider massage, icy hot, muscle relaxer

## 2019-04-04 ENCOUNTER — Ambulatory Visit (INDEPENDENT_AMBULATORY_CARE_PROVIDER_SITE_OTHER): Payer: BC Managed Care – PPO | Admitting: Psychology

## 2019-04-04 DIAGNOSIS — F411 Generalized anxiety disorder: Secondary | ICD-10-CM | POA: Diagnosis not present

## 2019-04-04 DIAGNOSIS — F331 Major depressive disorder, recurrent, moderate: Secondary | ICD-10-CM

## 2019-04-18 ENCOUNTER — Ambulatory Visit (INDEPENDENT_AMBULATORY_CARE_PROVIDER_SITE_OTHER): Payer: BC Managed Care – PPO | Admitting: Psychology

## 2019-04-18 DIAGNOSIS — F411 Generalized anxiety disorder: Secondary | ICD-10-CM

## 2019-04-18 DIAGNOSIS — F331 Major depressive disorder, recurrent, moderate: Secondary | ICD-10-CM

## 2019-04-28 ENCOUNTER — Other Ambulatory Visit: Payer: Self-pay | Admitting: Physician Assistant

## 2019-04-28 ENCOUNTER — Other Ambulatory Visit: Payer: Self-pay | Admitting: *Deleted

## 2019-04-28 DIAGNOSIS — F331 Major depressive disorder, recurrent, moderate: Secondary | ICD-10-CM

## 2019-05-02 ENCOUNTER — Ambulatory Visit (INDEPENDENT_AMBULATORY_CARE_PROVIDER_SITE_OTHER): Payer: BC Managed Care – PPO | Admitting: Psychology

## 2019-05-02 DIAGNOSIS — F331 Major depressive disorder, recurrent, moderate: Secondary | ICD-10-CM | POA: Diagnosis not present

## 2019-05-02 DIAGNOSIS — F411 Generalized anxiety disorder: Secondary | ICD-10-CM

## 2019-05-16 ENCOUNTER — Ambulatory Visit (INDEPENDENT_AMBULATORY_CARE_PROVIDER_SITE_OTHER): Payer: BC Managed Care – PPO | Admitting: Psychology

## 2019-05-16 DIAGNOSIS — F331 Major depressive disorder, recurrent, moderate: Secondary | ICD-10-CM

## 2019-05-16 DIAGNOSIS — F411 Generalized anxiety disorder: Secondary | ICD-10-CM | POA: Diagnosis not present

## 2019-05-30 ENCOUNTER — Ambulatory Visit (INDEPENDENT_AMBULATORY_CARE_PROVIDER_SITE_OTHER): Payer: BC Managed Care – PPO | Admitting: Psychology

## 2019-05-30 DIAGNOSIS — F331 Major depressive disorder, recurrent, moderate: Secondary | ICD-10-CM

## 2019-05-30 DIAGNOSIS — F411 Generalized anxiety disorder: Secondary | ICD-10-CM | POA: Diagnosis not present

## 2019-06-13 ENCOUNTER — Ambulatory Visit: Payer: BC Managed Care – PPO | Admitting: Psychology

## 2019-06-27 ENCOUNTER — Ambulatory Visit: Payer: BC Managed Care – PPO | Admitting: Psychology

## 2019-06-27 ENCOUNTER — Ambulatory Visit: Payer: BC Managed Care – PPO | Admitting: Physician Assistant

## 2019-07-04 ENCOUNTER — Other Ambulatory Visit: Payer: Self-pay

## 2019-07-04 ENCOUNTER — Ambulatory Visit (INDEPENDENT_AMBULATORY_CARE_PROVIDER_SITE_OTHER): Payer: BC Managed Care – PPO | Admitting: Physician Assistant

## 2019-07-04 VITALS — BP 127/87 | HR 91 | Ht 64.0 in | Wt 337.0 lb

## 2019-07-04 DIAGNOSIS — F331 Major depressive disorder, recurrent, moderate: Secondary | ICD-10-CM | POA: Diagnosis not present

## 2019-07-04 DIAGNOSIS — F332 Major depressive disorder, recurrent severe without psychotic features: Secondary | ICD-10-CM

## 2019-07-04 DIAGNOSIS — Z6841 Body Mass Index (BMI) 40.0 and over, adult: Secondary | ICD-10-CM

## 2019-07-04 DIAGNOSIS — F411 Generalized anxiety disorder: Secondary | ICD-10-CM

## 2019-07-04 DIAGNOSIS — E118 Type 2 diabetes mellitus with unspecified complications: Secondary | ICD-10-CM | POA: Diagnosis not present

## 2019-07-04 LAB — POCT GLYCOSYLATED HEMOGLOBIN (HGB A1C): Hemoglobin A1C: 6.8 % — AB (ref 4.0–5.6)

## 2019-07-04 MED ORDER — FLUOXETINE HCL 20 MG PO TABS: 20.0000 mg | ORAL_TABLET | Freq: Every day | ORAL | 2 refills | Status: DC

## 2019-07-04 MED ORDER — BUPROPION HCL 100 MG PO TABS: 100.0000 mg | ORAL_TABLET | Freq: Two times a day (BID) | ORAL | 2 refills | Status: DC

## 2019-07-04 NOTE — Patient Instructions (Signed)
Increase wellbutrin to 100mg  twice a day.  Increase prozac to 20mg  daily.  Will get surgical consult.  Will refill out paperwork for ozempic assistance.

## 2019-07-05 ENCOUNTER — Encounter: Payer: Self-pay | Admitting: Physician Assistant

## 2019-07-05 NOTE — Progress Notes (Signed)
Subjective:    Patient ID: Karen Stokes, female    DOB: 1985/05/22, 34 y.o.   MRN: [id number]  HPI  Pt is a 34 yo morbidly obese female with T2DM, Migraines, HTN, HLD, GAD, MDD, compulsive behaviors who presents to the clinic for 3 month follow up.   Diabetes-she is not checking her sugars.  She is taking her Ozempic although she is concerned she might not be able to afford it in the next month.  She also taking [place].  She is not able to tolerate Metformin.  She denies any hypoglycemic events.  She denies any open sores or wounds.  She admits in the last month she has not been watching her diet and not exercising.  She was doing really well with weight loss but her mood and recent worsening of depression has caused her just not to care.  She has gained 3 pounds in the last 3 months.  She is unsure why her depression has worsened.  She has literally no motivation to do anything.  She feels hopeless and helpless.  She denies any suicidal or homicidal thoughts.  She is not frustrated also with her weight.  She feels like all it takes is one thing and she starts gaining again.  She admits her last month has not been good with her diet.  She wonders what some options are for weight loss.  Patient has tried numerous diets over the last 15 years. She has tried weight watchers, beach body, low carb/high protein, exercise.    .. Active Ambulatory Problems    Diagnosis Date Noted  . Eosinophilic esophagitis   . Irritable bowel syndrome   . Palpitations 02/01/2018  . Class 3 severe obesity due to excess calories with body mass index (BMI) of 60.0 to 69.9 in adult ([place]) 02/01/2018  . Uncontrolled stage 2 hypertension 02/01/2018  . Bilateral low back pain without sciatica 02/01/2018  . Dyspnea on exertion 02/01/2018  . GAD (generalized anxiety disorder) 02/01/2018  . Severe episode of recurrent major depressive disorder, without psychotic features ([place]) 02/01/2018  . Uncontrolled type 2 diabetes  mellitus with hyperglycemia ([place]) 02/13/2018  . Intractable migraine without aura and without status migrainosus 03/17/2018  . Encounter for routine checking of intrauterine contraceptive device (IUD) 03/17/2018  . Hyperlipidemia associated with type 2 diabetes mellitus ([place]) 05/09/2018  . Hypertension associated with diabetes ([place]) 05/09/2018  . Compulsive behaviors 05/09/2018  . Diabetic eye exam ([place]) 05/09/2018  . History of shingles 12/21/2018  . Strain of rhomboid muscle 03/28/2019   Resolved Ambulatory Problems    Diagnosis Date Noted  . No Resolved Ambulatory Problems   Past Medical History:  Diagnosis Date  . Allergy   . Anxiety   . Depression   . GERD (gastroesophageal reflux disease)   . Hypertension   . Obesity      Review of Systems  All other systems reviewed and are negative.      Objective:   Physical Exam Vitals reviewed.  Constitutional:      Appearance: Normal appearance. She is obese.  Cardiovascular:     Rate and Rhythm: Normal rate.  Pulmonary:     Effort: Pulmonary effort is normal.  Neurological:     Mental Status: She is alert and oriented to person, place, and time.  Psychiatric:     Comments: Flat affect.     .. Depression screen [facility] 2/9 07/04/2019 03/28/2019 01/24/2019 12/27/2018 05/09/2018  Decreased Interest 1 0 1 1 2   Down, Depressed,  Hopeless 2 0 1 2 2   PHQ - 2 Score 3 0 2 3 4   Altered sleeping 2 0 0 2 3  Tired, decreased energy 1 0 0 3 3  Change in appetite 2 0 1 3 3   Feeling bad or failure about yourself  1 0 0 3 3  Trouble concentrating 2 0 0 3 2  Moving slowly or fidgety/restless 0 0 0 3 3  Suicidal thoughts 0 0 0 0 0  PHQ-9 Score 11 0 3 20 21   Difficult doing work/chores Somewhat difficult Not difficult at all Somewhat difficult - Extremely dIfficult   . GAD 7 : Generalized Anxiety Score 07/04/2019 03/28/2019 01/24/2019 12/27/2018  Nervous, Anxious, on Edge 2 0 1 3  Control/stop worrying 2 0 0 3  Worry too much -  different things 2 0 0 3  Trouble relaxing 2 0 0 3  Restless 2 0 0 3  Easily annoyed or irritable 3 0 0 0  Afraid - awful might happen 2 0 0 3  Total GAD 7 Score 15 0 1 18  Anxiety Difficulty Somewhat difficult Not difficult at all Not difficult at all -           Assessment & Plan:  3/25/202112/17/2020Irving was seen today for diabetes.  Diagnoses and all orders for this visit:  Controlled type 2 diabetes mellitus with complication, without long-term current use of insulin (HCC) -     POCT glycosylated hemoglobin (Hb A1C) -     Amb Referral to Bariatric Surgery  Moderate episode of recurrent major depressive disorder (HCC) -     buPROPion (WELLBUTRIN) 100 MG tablet; Take 1 tablet (100 mg total) by mouth 2 (two) times daily.  Class 3 severe obesity due to excess calories with serious comorbidity and body mass index (BMI) of 60.0 to 69.9 in adult [facility]) -     Amb Referral to Bariatric Surgery  Severe episode of recurrent major depressive disorder, without psychotic features (HCC) -     FLUoxetine (PROZAC) 20 MG tablet; Take 1 tablet (20 mg total) by mouth daily.  GAD (generalized anxiety disorder) -     FLUoxetine (PROZAC) 20 MG tablet; Take 1 tablet (20 mg total) by mouth daily.   .. Results for orders placed or performed in visit on 07/04/19  POCT glycosylated hemoglobin (Hb A1C)  Result Value Ref Range   Hemoglobin A1C 6.8 (A) 4.0 - 5.6 %   HbA1c POC (<> result, manual entry)     HbA1c, POC (prediabetic range)     HbA1c, POC (controlled diabetic range)     A1c is up but controlled. Stay on same medications.  I was interested in increasing Ozempic for more weight loss but patient concerned about cost right now.  We did have her fill out patient assistance forms in office today. On ARB blood pressure to goal. On statin. Follow-up in 3 months.  Patient does have a counselor encouraged her to get back in with her regularly.  Increase Wellbutrin to 100 mg twice a day.  Increase Prozac to  20 mg daily.  Follow-up in 6 to 8 weeks.  Patient has tried numerous diets over the last 15 years.  Do not think phentermine is a good option for patient as she already has anxiety.  Concerned about adding Topamax since she already has ongoing depression.  She is on Wellbutrin and GLP-1.  I would like for her to have a bariatric consult as she has at least 100lbs to  lose.   Spent 35 minutes with patient.

## 2019-07-11 ENCOUNTER — Ambulatory Visit: Payer: BC Managed Care – PPO | Admitting: Psychology

## 2019-07-17 ENCOUNTER — Other Ambulatory Visit: Payer: Self-pay | Admitting: Physician Assistant

## 2019-07-17 DIAGNOSIS — F332 Major depressive disorder, recurrent severe without psychotic features: Secondary | ICD-10-CM

## 2019-07-17 DIAGNOSIS — F411 Generalized anxiety disorder: Secondary | ICD-10-CM

## 2019-07-17 DIAGNOSIS — F331 Major depressive disorder, recurrent, moderate: Secondary | ICD-10-CM

## 2019-07-25 ENCOUNTER — Ambulatory Visit: Payer: BC Managed Care – PPO | Admitting: Psychology

## 2019-08-01 ENCOUNTER — Telehealth (INDEPENDENT_AMBULATORY_CARE_PROVIDER_SITE_OTHER): Payer: BC Managed Care – PPO | Admitting: Physician Assistant

## 2019-08-01 VITALS — Ht 64.0 in | Wt 337.0 lb

## 2019-08-01 DIAGNOSIS — J309 Allergic rhinitis, unspecified: Secondary | ICD-10-CM

## 2019-08-01 DIAGNOSIS — J302 Other seasonal allergic rhinitis: Secondary | ICD-10-CM

## 2019-08-01 DIAGNOSIS — H1013 Acute atopic conjunctivitis, bilateral: Secondary | ICD-10-CM

## 2019-08-01 MED ORDER — METHYLPREDNISOLONE 4 MG PO TBPK: ORAL_TABLET | ORAL | 0 refills | Status: DC

## 2019-08-01 MED ORDER — AZELASTINE HCL 0.05 % OP SOLN: 1.0000 [drp] | Freq: Two times a day (BID) | OPHTHALMIC | 11 refills | Status: AC

## 2019-08-01 NOTE — Progress Notes (Signed)
Allergy issues Eye - irritation Nose bleeds Has had for years, but worse this year Trouble sleeping  Taking Claritin in the AM, benadryl at night (past 3 nights) Can't take nasal sprays due to pain in nose (raw)

## 2019-08-01 NOTE — Progress Notes (Signed)
Patient ID: Karen Stokes, female   DOB: 06-Apr-1986, 34 y.o.   MRN: [id number] .Marland KitchenVirtual Visit via Video Note  I connected with Nickolas Madrid on 08/01/2019 at 10:50 AM EDT by a video enabled telemedicine application and verified that I am speaking with the correct person using two identifiers.  Location: Patient: home Provider: clinic   I discussed the limitations of evaluation and management by telemedicine and the availability of in person appointments. The patient expressed understanding and agreed to proceed.  History of Present Illness: Patient is a 34 year old female who calls into the clinic with allergy symptoms.  Patient has a history of seasonal allergies.  She feels like that has not been this bad as this year in the past.  She is having a lot of eye irritation with itchiness, watering, swollen and irritation.  She is having a lot of nasal congestion and sinus pressure.  She has tried using over-the-counter nose sprays but they dry her nose out too much and create a nosebleed.  She is sneezing and has a dry cough.  She denies any fever, chills, shortness of breath, wheezing.  She is on daily Claritin which seems to help the most.  She has tried [employer] in the past and her symptoms were much worse.  She is also taking Benadryl at night which seems to help her go to sleep.    .. Active Ambulatory Problems    Diagnosis Date Noted  . Eosinophilic esophagitis   . Irritable bowel syndrome   . Palpitations 02/01/2018  . Class 3 severe obesity due to excess calories with body mass index (BMI) of 60.0 to 69.9 in adult (HCC) 02/01/2018  . Uncontrolled stage 2 hypertension 02/01/2018  . Bilateral low back pain without sciatica 02/01/2018  . Dyspnea on exertion 02/01/2018  . GAD (generalized anxiety disorder) 02/01/2018  . Severe episode of recurrent major depressive disorder, without psychotic features (HCC) 02/01/2018  . Uncontrolled type 2 diabetes mellitus with hyperglycemia (HCC)  02/13/2018  . Intractable migraine without aura and without status migrainosus 03/17/2018  . Encounter for routine checking of intrauterine contraceptive device (IUD) 03/17/2018  . Hyperlipidemia associated with type 2 diabetes mellitus (HCC) 05/09/2018  . Hypertension associated with diabetes (HCC) 05/09/2018  . Compulsive behaviors 05/09/2018  . Diabetic eye exam (HCC) 05/09/2018  . History of shingles 12/21/2018  . Strain of rhomboid muscle 03/28/2019  . Seasonal allergies 08/01/2019  . Allergic conjunctivitis and rhinitis, bilateral 08/01/2019   Resolved Ambulatory Problems    Diagnosis Date Noted  . No Resolved Ambulatory Problems   Past Medical History:  Diagnosis Date  . Allergy   . Anxiety   . Depression   . GERD (gastroesophageal reflux disease)   . Hypertension   . Obesity    Reviewed med, allergy, problem list.   Observations/Objective: No acute distress. Swollen red eyes. Dry cough. Congested sounding.  .. Today's Vitals   08/01/19 1033  Weight: (!) 337 lb (152.9 kg)  Height: 5\' 4"  (1.626 m)   Body mass index is 57.85 kg/m.   Assessment and Plan: Marland KitchenArdra was seen today for allergies.  Diagnoses and all orders for this visit:  Seasonal allergies -     methylPREDNISolone (MEDROL DOSEPAK) 4 MG TBPK tablet; Take as directed by package insert.  Allergic conjunctivitis and rhinitis, bilateral -     azelastine (OPTIVAR) 0.05 % ophthalmic solution; Place 1 drop into both eyes 2 (two) times daily. -     methylPREDNISolone (MEDROL DOSEPAK) 4 MG TBPK tablet;  Take as directed by package insert.  Allergic sinusitis -     methylPREDNISolone (MEDROL DOSEPAK) 4 MG TBPK tablet; Take as directed by package insert.   Prednisone taper was given for exacerbation of allergy symptoms. optivar for eyes. claritin daily. Consider trying another anti-histamine in future or adding singulair. Consider allergy testing for immunotherapy if symptoms continue to be this severe.  No sign of bacterial infection today. Call back with any new symptoms.    Follow Up Instructions:    I discussed the assessment and treatment plan with the patient. The patient was provided an opportunity to ask questions and all were answered. The patient agreed with the plan and demonstrated an understanding of the instructions.   The patient was advised to call back or seek an in-person evaluation if the symptoms worsen or if the condition fails to improve as anticipated.  I provided 15 minutes of non-face-to-face time during this encounter.   [place] Planas, PA-C

## 2019-08-02 ENCOUNTER — Encounter: Payer: Self-pay | Admitting: Physician Assistant

## 2019-08-08 ENCOUNTER — Ambulatory Visit: Payer: BC Managed Care – PPO | Admitting: Psychology

## 2019-08-22 ENCOUNTER — Ambulatory Visit: Payer: BC Managed Care – PPO | Admitting: Psychology

## 2019-09-01 ENCOUNTER — Other Ambulatory Visit: Payer: Self-pay | Admitting: Physician Assistant

## 2019-09-05 ENCOUNTER — Ambulatory Visit (INDEPENDENT_AMBULATORY_CARE_PROVIDER_SITE_OTHER): Payer: BC Managed Care – PPO | Admitting: Physician Assistant

## 2019-09-05 ENCOUNTER — Ambulatory Visit: Payer: BC Managed Care – PPO | Admitting: Psychology

## 2019-09-05 NOTE — Progress Notes (Signed)
Called to cancel

## 2019-09-13 ENCOUNTER — Telehealth: Payer: Self-pay | Admitting: Neurology

## 2019-09-13 NOTE — Telephone Encounter (Signed)
Patient left vm.  She missed last appt, but has not been on diabetes medication due to cost. She has sent in Ozempic patient assistance, she states, but hasn't heard anything. She has asked [facility] about assistance programs, but hasn't gotten anywhere. She is wondering if you could change her to cheaper medication before her follow up. Please advise.

## 2019-09-15 NOTE — Telephone Encounter (Signed)
We can try to send medications but likely not in same class.all GLP-1 are branded. Can we look into why not covering? Or if trulicity etc is covered.

## 2019-09-15 NOTE — Telephone Encounter (Signed)
Jade    I have not received a prior authorization request for this medication I can try to run one or a tier exception on Monday to see if we can get it covered. I am doing referrals today. - CF

## 2019-09-19 NOTE — Telephone Encounter (Signed)
Ok thanks 

## 2019-09-27 ENCOUNTER — Encounter: Payer: Self-pay | Admitting: Physician Assistant

## 2019-09-27 ENCOUNTER — Ambulatory Visit (INDEPENDENT_AMBULATORY_CARE_PROVIDER_SITE_OTHER): Payer: BC Managed Care – PPO | Admitting: Physician Assistant

## 2019-09-27 VITALS — BP 139/92 | HR 96 | Ht 64.0 in | Wt 349.0 lb

## 2019-09-27 DIAGNOSIS — E1159 Type 2 diabetes mellitus with other circulatory complications: Secondary | ICD-10-CM | POA: Diagnosis not present

## 2019-09-27 DIAGNOSIS — E118 Type 2 diabetes mellitus with unspecified complications: Secondary | ICD-10-CM | POA: Diagnosis not present

## 2019-09-27 DIAGNOSIS — I10 Essential (primary) hypertension: Secondary | ICD-10-CM

## 2019-09-27 DIAGNOSIS — L299 Pruritus, unspecified: Secondary | ICD-10-CM

## 2019-09-27 DIAGNOSIS — F332 Major depressive disorder, recurrent severe without psychotic features: Secondary | ICD-10-CM

## 2019-09-27 DIAGNOSIS — I152 Hypertension secondary to endocrine disorders: Secondary | ICD-10-CM

## 2019-09-27 DIAGNOSIS — F411 Generalized anxiety disorder: Secondary | ICD-10-CM | POA: Diagnosis not present

## 2019-09-27 MED ORDER — OZEMPIC (0.25 OR 0.5 MG/DOSE) 2 MG/1.5ML ~~LOC~~ SOPN: 0.2500 mg | PEN_INJECTOR | SUBCUTANEOUS | 1 refills | Status: DC

## 2019-09-27 MED ORDER — GLIPIZIDE 5 MG PO TABS: 5.0000 mg | ORAL_TABLET | Freq: Two times a day (BID) | ORAL | 2 refills | Status: DC

## 2019-09-27 MED ORDER — GABAPENTIN 100 MG PO CAPS
ORAL_CAPSULE | ORAL | 1 refills | Status: DC
Start: 2019-09-27 — End: 2021-01-01

## 2019-09-27 MED ORDER — FLUOXETINE HCL 40 MG PO CAPS: 40.0000 mg | ORAL_CAPSULE | Freq: Every day | ORAL | 1 refills | Status: DC

## 2019-09-27 MED ORDER — HYDROXYZINE HCL 10 MG PO TABS: 10.0000 mg | ORAL_TABLET | Freq: Three times a day (TID) | ORAL | 0 refills | Status: DC | PRN

## 2019-09-27 NOTE — Patient Instructions (Signed)
Increase prozac to 40 mg daily

## 2019-09-27 NOTE — Progress Notes (Signed)
Subjective:    Patient ID: Karen Stokes, female    DOB: 1985-05-30, 34 y.o.   MRN: [id number]  HPI  Patient is a 34 year old obese female with type 2 diabetes, hypertension, IBS, and acinic esophagitis, major depression, GAD who presents to the clinic for follow-up.   Patient is frustrated with her insurance right now.  She has not been able to get Ozempic filled. She also filled out patient assistance and not heard anything in about 6 weeks.  She is not currently on any medication for diabetes.  She cannot tolerate Metformin or Januvia.  Her last A1c was 6.8 and she was losing weight.  She has started back gaining weight.  She is not exercising.  She denies any hypoglycemic events.  She is also been struggling with her mood.  Her depression and anxiety are worse.  Her daughter is not doing well with her mental health therefore she feels like her mental health is not doing as well.  For 2 weeks her scalp is itched.  She denies any scales, lesions, sores.  She has never had anything like this before.  She denies any pain or burning.  She has not started any new medication.  She has stopped a lot of her medication.  She does admit to be very overwhelmed and stressed.   .. Active Ambulatory Problems    Diagnosis Date Noted  . Eosinophilic esophagitis   . Irritable bowel syndrome   . Palpitations 02/01/2018  . Class 3 severe obesity due to excess calories with body mass index (BMI) of 60.0 to 69.9 in adult (HCC) 02/01/2018  . Uncontrolled stage 2 hypertension 02/01/2018  . Bilateral low back pain without sciatica 02/01/2018  . Dyspnea on exertion 02/01/2018  . GAD (generalized anxiety disorder) 02/01/2018  . Severe episode of recurrent major depressive disorder, without psychotic features (HCC) 02/01/2018  . Uncontrolled type 2 diabetes mellitus with hyperglycemia (HCC) 02/13/2018  . Intractable migraine without aura and without status migrainosus 03/17/2018  . Encounter for routine  checking of intrauterine contraceptive device (IUD) 03/17/2018  . Hyperlipidemia associated with type 2 diabetes mellitus (HCC) 05/09/2018  . Hypertension associated with diabetes (HCC) 05/09/2018  . Compulsive behaviors 05/09/2018  . Diabetic eye exam (HCC) 05/09/2018  . History of shingles 12/21/2018  . Strain of rhomboid muscle 03/28/2019  . Seasonal allergies 08/01/2019  . Allergic conjunctivitis and rhinitis, bilateral 08/01/2019  . Scalp itch 09/27/2019  . Controlled type 2 diabetes mellitus with complication, without long-term current use of insulin (HCC) 09/27/2019   Resolved Ambulatory Problems    Diagnosis Date Noted  . No Resolved Ambulatory Problems   Past Medical History:  Diagnosis Date  . Allergy   . Anxiety   . Depression   . GERD (gastroesophageal reflux disease)   . Hypertension   . Obesity       Review of Systems  All other systems reviewed and are negative.      Objective:   Physical Exam Vitals reviewed.  Constitutional:      Appearance: Normal appearance. She is obese.  HENT:     Head:     Comments: No rash, erythema, mites, excoriations.  Cardiovascular:     Rate and Rhythm: Normal rate and regular rhythm.  Pulmonary:     Effort: Pulmonary effort is normal.     Breath sounds: Normal breath sounds.  Neurological:     General: No focal deficit present.     Mental Status: She is alert and oriented to  person, place, and time.  Psychiatric:        Mood and Affect: Mood normal.     .. Depression screen [facility] 2/9 09/27/2019 07/04/2019 03/28/2019 01/24/2019 12/27/2018  Decreased Interest 2 1 0 1 1  Down, Depressed, Hopeless 2 2 0 1 2  PHQ - 2 Score 4 3 0 2 3  Altered sleeping 3 2 0 0 2  Tired, decreased energy 3 1 0 0 3  Change in appetite 3 2 0 1 3  Feeling bad or failure about yourself  3 1 0 0 3  Trouble concentrating 3 2 0 0 3  Moving slowly or fidgety/restless 3 0 0 0 3  Suicidal thoughts 0 0 0 0 0  PHQ-9 Score 22 11 0 3 20  Difficult  doing work/chores Very difficult Somewhat difficult Not difficult at all Somewhat difficult -  Some recent data might be hidden   .Marland Kitchen GAD 7 : Generalized Anxiety Score 09/27/2019 07/04/2019 03/28/2019 01/24/2019  Nervous, Anxious, on Edge 2 2 0 1  Control/stop worrying 2 2 0 0  Worry too much - different things 2 2 0 0  Trouble relaxing 2 2 0 0  Restless 2 2 0 0  Easily annoyed or irritable 2 3 0 0  Afraid - awful might happen 2 2 0 0  Total GAD 7 Score 14 15 0 1  Anxiety Difficulty Very difficult Somewhat difficult Not difficult at all Not difficult at all         Assessment & Plan:  Marland KitchenMarland KitchenAmir was seen today for depression and diabetes.  Diagnoses and all orders for this visit:  Controlled type 2 diabetes mellitus with complication, without long-term current use of insulin (HCC) -     Semaglutide,0.25 or 0.5MG /DOS, (OZEMPIC, 0.25 OR 0.5 MG/DOSE,) 2 MG/1.5ML SOPN; Inject 0.1875 mLs (0.25 mg total) into the skin once a week. -     glipiZIDE (GLUCOTROL) 5 MG tablet; Take 1 tablet (5 mg total) by mouth 2 (two) times daily before a meal.  Scalp itch -     hydrOXYzine (ATARAX/VISTARIL) 10 MG tablet; Take 1 tablet (10 mg total) by mouth 3 (three) times daily as needed for itching.  Hypertension associated with diabetes ([place])  GAD (generalized anxiety disorder) -     FLUoxetine (PROZAC) 40 MG capsule; Take 1 capsule (40 mg total) by mouth daily.  Severe episode of recurrent major depressive disorder, without psychotic features (HCC) -     FLUoxetine (PROZAC) 40 MG capsule; Take 1 capsule (40 mg total) by mouth daily.  Other orders -     gabapentin (NEURONTIN) 100 MG capsule; Take 1- 3 tablets at bedtime.   While we are waiting on diabetic medications to get sorted out I would like for patient to start glipizide twice a day.  She cannot tolerate Metformin or Januvia.  She was doing very well on Ozempic.  I would like to get patient samples while we wait on patient assistance.  She said  she like it sent through [facility] and so that she is failed other 2 medicines.  I will resend this to [employer].  Discussed regular exercise at least 150 minutes a week.  Encouraged low sugar and carb diet.  PHQ-9 and GAD-7 were elevated.  Increase Prozac to 40 mg.  Wellbutrin at the same dose.  Follow-up in 4 to 6 weeks on mood.  No known etiology for scalp itching.  I did give her some hydroxyzine to try.  Did not see any  thing on her scalp such as a rash.  Not sure if this could be a stress response.  Patient does have some history of neurogenic pain.  We did refill her gabapentin today to use as needed.

## 2019-09-29 ENCOUNTER — Telehealth: Payer: Self-pay | Admitting: Physician Assistant

## 2019-09-29 NOTE — Telephone Encounter (Signed)
Resent our portion of patient assistance forms. Patient did not give [place] their forms that needed completed. She was to turn them in herself, which she did state she did. Resent physician portion to [phone] with confirmation received. 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

## 2019-09-29 NOTE — Telephone Encounter (Signed)
Did we resend her ozempic patient assistance stuff?  Can we contact rep and let them know patient is having trouble getting medications?  Could they give samples?

## 2019-09-29 NOTE — Telephone Encounter (Signed)
Email sent to [facility] with Ozempic at [email or web address] and awaiting word on samples.

## 2019-10-03 NOTE — Telephone Encounter (Signed)
Ozempic samples received. Mychart message sent to patient.

## 2019-12-27 ENCOUNTER — Other Ambulatory Visit: Payer: Self-pay

## 2019-12-27 ENCOUNTER — Encounter: Payer: Self-pay | Admitting: Physician Assistant

## 2019-12-27 ENCOUNTER — Ambulatory Visit (INDEPENDENT_AMBULATORY_CARE_PROVIDER_SITE_OTHER): Payer: BC Managed Care – PPO | Admitting: Physician Assistant

## 2019-12-27 ENCOUNTER — Telehealth: Payer: Self-pay | Admitting: Neurology

## 2019-12-27 VITALS — BP 148/92 | HR 93 | Ht 64.0 in | Wt 351.0 lb

## 2019-12-27 DIAGNOSIS — E1169 Type 2 diabetes mellitus with other specified complication: Secondary | ICD-10-CM

## 2019-12-27 DIAGNOSIS — Z79899 Other long term (current) drug therapy: Secondary | ICD-10-CM

## 2019-12-27 DIAGNOSIS — F331 Major depressive disorder, recurrent, moderate: Secondary | ICD-10-CM | POA: Insufficient documentation

## 2019-12-27 DIAGNOSIS — E1159 Type 2 diabetes mellitus with other circulatory complications: Secondary | ICD-10-CM

## 2019-12-27 DIAGNOSIS — E1165 Type 2 diabetes mellitus with hyperglycemia: Secondary | ICD-10-CM | POA: Diagnosis not present

## 2019-12-27 DIAGNOSIS — I152 Hypertension secondary to endocrine disorders: Secondary | ICD-10-CM

## 2019-12-27 DIAGNOSIS — Z23 Encounter for immunization: Secondary | ICD-10-CM | POA: Diagnosis not present

## 2019-12-27 DIAGNOSIS — I10 Essential (primary) hypertension: Secondary | ICD-10-CM

## 2019-12-27 DIAGNOSIS — E785 Hyperlipidemia, unspecified: Secondary | ICD-10-CM

## 2019-12-27 LAB — POCT GLYCOSYLATED HEMOGLOBIN (HGB A1C): Hemoglobin A1C: 7.4 % — AB (ref 4.0–5.6)

## 2019-12-27 MED ORDER — VALSARTAN-HYDROCHLOROTHIAZIDE 320-25 MG PO TABS
1.0000 | ORAL_TABLET | Freq: Every day | ORAL | 0 refills | Status: DC
Start: 2019-12-27 — End: 2020-03-25

## 2019-12-27 MED ORDER — BUPROPION HCL 100 MG PO TABS: 100.0000 mg | ORAL_TABLET | Freq: Two times a day (BID) | ORAL | 1 refills | Status: DC

## 2019-12-27 MED ORDER — WEGOVY 0.5 MG/0.5ML ~~LOC~~ SOAJ: 0.5000 mg | SUBCUTANEOUS | 0 refills | Status: DC

## 2019-12-27 NOTE — Progress Notes (Addendum)
Subjective:    Patient ID: Karen Stokes, female    DOB: 1986/02/10, 34 y.o.   MRN: [id number]  HPI  34 year old female presents to clinic for her diabetes follow-up.   Still taking ozempic but is having more  Issues with cravings and blood pressure  She wants to see if there is a way to go back on forxiga. Thinks it helping with with blood pressure more. Wants to restart this unless there is a problem with coverage She states that she can notice when her blood pressure goes up and experiences flushing.  She states that she is having some sciatic pain and wanted to discuss stretches that may help with this Mood has been variable but has been improving since she is taking her Prozac more regularly.Tends to be linked to the struggles she is experiencing with her daughters bullying. Wants Refill on fluozetine (make sure that she gets 40mg ). States her IBS is flaring up with the increased stress levels.   She has had migraines the last two days since she has run out of Imitrex. She says that they are well controlled when she has the medicine.  Sinus pressure and runny nose has been well managed since last visit   Patient states that she believed her A1c would be elevated this visit. She went off her meds for a bit due to life events leading to stress and forgot to take them 3 months ago. After a 2-3 week period she did restart all of her medications including ozempic( .25 mg she self titrated down). She states that she is doing better taking them regularly now and has purchased a medication organizer to keep up with daily doses.  .. Active Ambulatory Problems    Diagnosis Date Noted  . Eosinophilic esophagitis   . Irritable bowel syndrome   . Palpitations 02/01/2018  . Class 3 severe obesity due to excess calories with body mass index (BMI) of 60.0 to 69.9 in adult (HCC) 02/01/2018  . Uncontrolled stage 2 hypertension 02/01/2018  . Bilateral low back pain without sciatica 02/01/2018  .  Dyspnea on exertion 02/01/2018  . GAD (generalized anxiety disorder) 02/01/2018  . Severe episode of recurrent major depressive disorder, without psychotic features (HCC) 02/01/2018  . Uncontrolled type 2 diabetes mellitus with hyperglycemia (HCC) 02/13/2018  . Intractable migraine without aura and without status migrainosus 03/17/2018  . Encounter for routine checking of intrauterine contraceptive device (IUD) 03/17/2018  . Hyperlipidemia associated with type 2 diabetes mellitus (HCC) 05/09/2018  . Hypertension associated with diabetes (HCC) 05/09/2018  . Compulsive behaviors 05/09/2018  . Diabetic eye exam (HCC) 05/09/2018  . History of shingles 12/21/2018  . Strain of rhomboid muscle 03/28/2019  . Seasonal allergies 08/01/2019  . Allergic conjunctivitis and rhinitis, bilateral 08/01/2019  . Scalp itch 09/27/2019  . Controlled type 2 diabetes mellitus with complication, without long-term current use of insulin (HCC) 09/27/2019  . Moderate episode of recurrent major depressive disorder (HCC) 12/27/2019   Resolved Ambulatory Problems    Diagnosis Date Noted  . No Resolved Ambulatory Problems   Past Medical History:  Diagnosis Date  . Allergy   . Anxiety   . Depression   . GERD (gastroesophageal reflux disease)   . Hypertension   . Obesity         Review of Systems  Constitutional: Negative for chills and fever.  HENT: Negative for rhinorrhea, sinus pressure and sinus pain.   Respiratory: Negative for chest tightness and shortness of breath.  Cardiovascular: Negative for chest pain and palpitations.  Gastrointestinal: Positive for diarrhea. Negative for blood in stool.       Objective:   Physical Exam Vitals reviewed.  Constitutional:      Appearance: Normal appearance. She is obese.  HENT:     Nose: No congestion or rhinorrhea.  Cardiovascular:     Rate and Rhythm: Normal rate and regular rhythm.     Pulses: Normal pulses.  Pulmonary:     Effort: Pulmonary  effort is normal. No respiratory distress.     Breath sounds: Normal breath sounds. No wheezing or rales.  Musculoskeletal:     Right lower leg: No edema.     Left lower leg: No edema.  Neurological:     General: No focal deficit present.     Mental Status: She is oriented to person, place, and time.  Psychiatric:        Mood and Affect: Mood normal.    .. Lab Results  Component Value Date   HGBA1C 7.4 (A) 12/27/2019         Assessment & Plan:  34 year old obese  female patient with uncontrolled diabetes mellitus, MDD, migraines, back pain and hypertension.  1)Uncontrolled type 2 diabetes mellitus with hyperglycemia (HCC)  -Encouraged exercise 150 min a week and healthy diet -Discontinue ozempic due to financial concerns -Initiate [place] for weight loss and diabetes control. Pt will need to call every 4 weeks with update and titrate to next dose.  - flu and pneumonia vaccine given.   2)hypertension associated with DM -increase diovan to 320-25mg  -Advised patient to begin monitoring BP with at home cuff  3)Moderate episode of recurrent major depressive disorder (HCC) -Prozac 40 mg PO qd -re-enforced the importance of taking this medication regularly to achieve desired affect with depression and anxiety control.  4)back pain - encouraged exercises and stretching including pyriformis figure 4 stretch demonstrated in exam room. -patient should call if worsening  Follow up in 3 months.   Marland KitchenHarlon Flor PA-C, have reviewed and agree with the above documentation in it's entirety.

## 2019-12-27 NOTE — Telephone Encounter (Signed)
Sent Ozempic patient assistance forms to [phone] with confirmation received. (Patient completed in the office). They will contact patient directly.

## 2020-01-01 ENCOUNTER — Encounter: Payer: Self-pay | Admitting: Physician Assistant

## 2020-01-01 ENCOUNTER — Telehealth: Payer: Self-pay | Admitting: Neurology

## 2020-01-01 MED ORDER — ATORVASTATIN CALCIUM 20 MG PO TABS: ORAL_TABLET | ORAL | 0 refills | Status: DC

## 2020-01-01 NOTE — Telephone Encounter (Signed)
Patient left vm stating [facility] did not get refills on Prozac, Atorvastatin, and Clonazepam.

## 2020-01-10 ENCOUNTER — Other Ambulatory Visit: Payer: Self-pay | Admitting: Physician Assistant

## 2020-01-10 DIAGNOSIS — E118 Type 2 diabetes mellitus with unspecified complications: Secondary | ICD-10-CM

## 2020-01-25 ENCOUNTER — Other Ambulatory Visit: Payer: Self-pay | Admitting: Physician Assistant

## 2020-01-25 DIAGNOSIS — E1169 Type 2 diabetes mellitus with other specified complication: Secondary | ICD-10-CM | POA: Diagnosis not present

## 2020-01-25 DIAGNOSIS — Z79899 Other long term (current) drug therapy: Secondary | ICD-10-CM | POA: Diagnosis not present

## 2020-01-25 DIAGNOSIS — E785 Hyperlipidemia, unspecified: Secondary | ICD-10-CM | POA: Diagnosis not present

## 2020-01-26 LAB — COMPLETE METABOLIC PANEL WITH GFR
AG Ratio: 1.6 (calc) (ref 1.0–2.5)
ALT: 22 U/L (ref 6–29)
AST: 12 U/L (ref 10–30)
Albumin: 4.1 g/dL (ref 3.6–5.1)
Alkaline phosphatase (APISO): 89 U/L (ref 31–125)
BUN: 11 mg/dL (ref 7–25)
CO2: 25 mmol/L (ref 20–32)
Calcium: 8.9 mg/dL (ref 8.6–10.2)
Chloride: 101 mmol/L (ref 98–110)
Creat: 0.78 mg/dL (ref 0.50–1.10)
GFR, Est African American: 116 mL/min/{1.73_m2} (ref >=60)
GFR, Est Non African American: 100 mL/min/{1.73_m2} (ref >=60)
Globulin: 2.5 g/dL (calc) (ref 1.9–3.7)
Glucose, Bld: 136 mg/dL — ABNORMAL HIGH (ref 65–99)
Potassium: 3.9 mmol/L (ref 3.5–5.3)
Sodium: 136 mmol/L (ref 135–146)
Total Bilirubin: 0.4 mg/dL (ref 0.2–1.2)
Total Protein: 6.6 g/dL (ref 6.1–8.1)

## 2020-01-26 LAB — LIPID PANEL W/REFLEX DIRECT LDL
Cholesterol: 167 mg/dL (ref <200)
HDL: 38 mg/dL — ABNORMAL LOW (ref >=50)
LDL Cholesterol (Calc): 98 mg/dL (calc)
Non-HDL Cholesterol (Calc): 129 mg/dL (calc) (ref <130)
Total CHOL/HDL Ratio: 4.4 (calc) (ref <5.0)
Triglycerides: 224 mg/dL — ABNORMAL HIGH (ref <150)

## 2020-01-29 ENCOUNTER — Other Ambulatory Visit: Payer: Self-pay | Admitting: Physician Assistant

## 2020-01-29 ENCOUNTER — Encounter: Payer: Self-pay | Admitting: Physician Assistant

## 2020-01-29 MED ORDER — WEGOVY 1 MG/0.5ML ~~LOC~~ SOAJ: 1.0000 mg | SUBCUTANEOUS | 0 refills | Status: DC

## 2020-01-29 MED ORDER — ATORVASTATIN CALCIUM 40 MG PO TABS: 40.0000 mg | ORAL_TABLET | Freq: Every day | ORAL | 3 refills | Status: DC

## 2020-01-29 NOTE — Progress Notes (Signed)
Ranyah,   Liver enzymes MUCH better.  Glucose better.  LDL still not to goal due to goal of under 70. Sent lipitor 40mg  to replace 20mg .  TG elevated. Ok to start fish oil 4000mg  daily. Hopefully will decrease after glucose is better controlled.

## 2020-02-02 MED ORDER — WEGOVY 1 MG/0.5ML ~~LOC~~ SOAJ: 1.0000 mg | SUBCUTANEOUS | 0 refills | Status: DC

## 2020-02-02 NOTE — Addendum Note (Signed)
Addended bySilvio Pate on: 02/02/2020 02:37 PM   Modules accepted: Orders

## 2020-03-05 ENCOUNTER — Telehealth: Payer: Self-pay | Admitting: Neurology

## 2020-03-05 NOTE — Telephone Encounter (Signed)
Work form completed, but not signed by patient so can not send to company. Patient aware. She will pick up and turn in. Copy sent to scan. Original at front desk for patient pick up.

## 2020-03-24 ENCOUNTER — Other Ambulatory Visit: Payer: Self-pay | Admitting: Physician Assistant

## 2020-03-25 ENCOUNTER — Encounter: Payer: Self-pay | Admitting: Physician Assistant

## 2020-03-26 MED ORDER — WEGOVY 1.7 MG/0.75ML ~~LOC~~ SOAJ: 1.7000 mg | SUBCUTANEOUS | 0 refills | Status: DC

## 2020-03-27 ENCOUNTER — Other Ambulatory Visit: Payer: Self-pay

## 2020-03-27 ENCOUNTER — Encounter: Payer: Self-pay | Admitting: Physician Assistant

## 2020-03-27 ENCOUNTER — Ambulatory Visit (INDEPENDENT_AMBULATORY_CARE_PROVIDER_SITE_OTHER): Payer: BC Managed Care – PPO | Admitting: Physician Assistant

## 2020-03-27 VITALS — BP 113/80 | HR 90 | Ht 64.0 in | Wt 330.0 lb

## 2020-03-27 DIAGNOSIS — E1159 Type 2 diabetes mellitus with other circulatory complications: Secondary | ICD-10-CM

## 2020-03-27 DIAGNOSIS — E118 Type 2 diabetes mellitus with unspecified complications: Secondary | ICD-10-CM

## 2020-03-27 DIAGNOSIS — F411 Generalized anxiety disorder: Secondary | ICD-10-CM

## 2020-03-27 DIAGNOSIS — F439 Reaction to severe stress, unspecified: Secondary | ICD-10-CM

## 2020-03-27 DIAGNOSIS — I152 Hypertension secondary to endocrine disorders: Secondary | ICD-10-CM

## 2020-03-27 DIAGNOSIS — Z6841 Body Mass Index (BMI) 40.0 and over, adult: Secondary | ICD-10-CM

## 2020-03-27 LAB — POCT GLYCOSYLATED HEMOGLOBIN (HGB A1C): Hemoglobin A1C: 6.3 % — AB (ref 4.0–5.6)

## 2020-03-27 MED ORDER — CLONAZEPAM 0.5 MG PO TABS: 0.5000 mg | ORAL_TABLET | Freq: Two times a day (BID) | ORAL | 1 refills | Status: DC | PRN

## 2020-03-27 NOTE — Progress Notes (Signed)
Subjective:    Patient ID: Karen Stokes, female    DOB: 06/19/1985, 34 y.o.   MRN: [id number]  HPI  Patient is a 34 year old obese female with diabetes, hypertension, dyslipidemia, anxiety who presents to the clinic for her 33-month follow-up.  Patient was started on would go very for diabetes and weight loss.  She has done tremendously well.  She is down 21 pounds in the last 3 months.  She is making lots of diet changes as well as portion control.  She has been more active.  She denies any significant side effects.  She denies any hypoglycemic events.  She is not checking her sugars.  She denies any open sores or wounds.  She denies any chest pain, palpitations, headaches, vision changes.  Her anxiety and mood overall are stable.  Her daughter is having a lot of trouble with her mental health.  Occasionally she is using the Klonopin.  She request refill today.  She denies any suicidal thoughts or homicidal idealizations.  .. Active Ambulatory Problems    Diagnosis Date Noted  . Eosinophilic esophagitis   . Irritable bowel syndrome   . Palpitations 02/01/2018  . Class 3 severe obesity due to excess calories with serious comorbidity and body mass index (BMI) of 50.0 to 59.9 in adult (HCC) 02/01/2018  . Uncontrolled stage 2 hypertension 02/01/2018  . Bilateral low back pain without sciatica 02/01/2018  . Dyspnea on exertion 02/01/2018  . GAD (generalized anxiety disorder) 02/01/2018  . Severe episode of recurrent major depressive disorder, without psychotic features (HCC) 02/01/2018  . Uncontrolled type 2 diabetes mellitus with hyperglycemia (HCC) 02/13/2018  . Intractable migraine without aura and without status migrainosus 03/17/2018  . Encounter for routine checking of intrauterine contraceptive device (IUD) 03/17/2018  . Hyperlipidemia associated with type 2 diabetes mellitus (HCC) 05/09/2018  . Hypertension associated with diabetes (HCC) 05/09/2018  . Compulsive behaviors  05/09/2018  . Diabetic eye exam (HCC) 05/09/2018  . History of shingles 12/21/2018  . Strain of rhomboid muscle 03/28/2019  . Seasonal allergies 08/01/2019  . Allergic conjunctivitis and rhinitis, bilateral 08/01/2019  . Scalp itch 09/27/2019  . Controlled type 2 diabetes mellitus with complication, without long-term current use of insulin (HCC) 09/27/2019  . Moderate episode of recurrent major depressive disorder (HCC) 12/27/2019  . Stress at home 03/29/2020   Resolved Ambulatory Problems    Diagnosis Date Noted  . No Resolved Ambulatory Problems   Past Medical History:  Diagnosis Date  . Allergy   . Anxiety   . Depression   . GERD (gastroesophageal reflux disease)   . Hypertension   . Obesity      Review of Systems See HPI.     Objective:   Physical Exam Obese female. Heart: NSR. Lungs: normal effort and no abnormal breath sounds.  Mood: stable. No abnormalities.       Assessment & Plan:  Marland KitchenMarland KitchenYanni was seen today for diabetes.  Diagnoses and all orders for this visit:  Controlled type 2 diabetes mellitus with complication, without long-term current use of insulin (HCC) -     POCT glycosylated hemoglobin (Hb A1C)  Class 3 severe obesity due to excess calories with serious comorbidity and body mass index (BMI) of 50.0 to 59.9 in adult [facility])  Hypertension associated with diabetes (HCC)  GAD (generalized anxiety disorder) -     clonazePAM (KLONOPIN) 0.5 MG tablet; Take 1 tablet (0.5 mg total) by mouth 2 (two) times daily as needed for anxiety.  Stress at home -  clonazePAM (KLONOPIN) 0.5 MG tablet; Take 1 tablet (0.5 mg total) by mouth 2 (two) times daily as needed for anxiety.   .. Results for orders placed or performed in visit on 03/27/20  POCT glycosylated hemoglobin (Hb A1C)  Result Value Ref Range   Hemoglobin A1C 6.3 (A) 4.0 - 5.6 %   HbA1c POC (<> result, manual entry)     HbA1c, POC (prediabetic range)     HbA1c, POC (controlled diabetic range)      A1C is stable.  BP to goal.  On statin.  UTD foot exam.  .. Diabetic Foot Exam - Simple   Simple Foot Form Visual Inspection No deformities, no ulcerations, no other skin breakdown bilaterally: Yes Sensation Testing Intact to touch and monofilament testing bilaterally: Yes Pulse Check Posterior Tibialis and Dorsalis pulse intact bilaterally: Yes Comments     Needs eye exam.  Flu/covid/pneumonia vaccine UTD.   Pt doing great on wegovy. No concerns. Down 21lbs in 3 months. Increased to 1.7mg . follow up in 3 months.   Anxiety has its ups and downs. Her daughter is really struggling right now. Using klonapin as needed. Request refills. Not taking daily. Last refill June.

## 2020-03-27 NOTE — Progress Notes (Signed)
Currently on Wegovy 1 mg - doing well Down 21 lbs

## 2020-03-29 DIAGNOSIS — F439 Reaction to severe stress, unspecified: Secondary | ICD-10-CM | POA: Insufficient documentation

## 2020-05-21 ENCOUNTER — Encounter: Payer: Self-pay | Admitting: Physician Assistant

## 2020-05-21 ENCOUNTER — Other Ambulatory Visit: Payer: Self-pay

## 2020-05-21 ENCOUNTER — Ambulatory Visit (INDEPENDENT_AMBULATORY_CARE_PROVIDER_SITE_OTHER): Payer: BC Managed Care – PPO | Admitting: Physician Assistant

## 2020-05-21 VITALS — BP 135/90 | HR 101 | Temp 98.8°F | Wt 321.0 lb

## 2020-05-21 DIAGNOSIS — M545 Low back pain, unspecified: Secondary | ICD-10-CM | POA: Diagnosis not present

## 2020-05-21 DIAGNOSIS — M5442 Lumbago with sciatica, left side: Secondary | ICD-10-CM

## 2020-05-21 MED ORDER — IBUPROFEN 800 MG PO TABS: 800.0000 mg | ORAL_TABLET | Freq: Three times a day (TID) | ORAL | 0 refills | Status: AC | PRN

## 2020-05-21 MED ORDER — CYCLOBENZAPRINE HCL 5 MG PO TABS
5.0000 mg | ORAL_TABLET | Freq: Three times a day (TID) | ORAL | 0 refills | Status: DC | PRN
Start: 2020-05-21 — End: 2020-07-02

## 2020-05-21 MED ORDER — KETOROLAC TROMETHAMINE 60 MG/2ML IM SOLN
60.0000 mg | Freq: Once | INTRAMUSCULAR | Status: AC
Start: 2020-05-21 — End: 2020-05-21
  Administered 2020-05-21: 60 mg via INTRAMUSCULAR

## 2020-05-21 MED ORDER — HYDROCODONE-ACETAMINOPHEN 5-325 MG PO TABS: 1.0000 | ORAL_TABLET | Freq: Three times a day (TID) | ORAL | 0 refills | Status: AC | PRN

## 2020-05-21 MED ORDER — PREDNISONE 50 MG PO TABS: ORAL_TABLET | ORAL | 0 refills | Status: DC

## 2020-05-21 NOTE — Patient Instructions (Signed)

## 2020-05-21 NOTE — Progress Notes (Signed)
Subjective:    Patient ID: Karen Stokes, female    DOB: November 04, 1985, 35 y.o.   MRN: [id number]  HPI  Patient is a 35 year old obese female who presents to the clinic to discuss 1 week of low to the right back pain.  She denies any injury. She does admit to sitting a lot at work. Does not feel like back pain she has had before. Last xray was of lumbar spine 2020 and showed no degeneration. She did have a fall but was like 2 months ago. Rates pain 7/10 sitting and when getting up 8/10. No radicular symptoms, bowel or bladder dysfunction, saddle anesthesia, strength changes in legs. Taking ibuprofen and tylenol. Uses some ice and heat. No significant relief.   .. Active Ambulatory Problems    Diagnosis Date Noted  . Eosinophilic esophagitis   . Irritable bowel syndrome   . Palpitations 02/01/2018  . Class 3 severe obesity due to excess calories with serious comorbidity and body mass index (BMI) of 50.0 to 59.9 in adult (HCC) 02/01/2018  . Uncontrolled stage 2 hypertension 02/01/2018  . Acute right-sided low back pain without sciatica 02/01/2018  . Dyspnea on exertion 02/01/2018  . GAD (generalized anxiety disorder) 02/01/2018  . Severe episode of recurrent major depressive disorder, without psychotic features (HCC) 02/01/2018  . Uncontrolled type 2 diabetes mellitus with hyperglycemia (HCC) 02/13/2018  . Intractable migraine without aura and without status migrainosus 03/17/2018  . Encounter for routine checking of intrauterine contraceptive device (IUD) 03/17/2018  . Hyperlipidemia associated with type 2 diabetes mellitus (HCC) 05/09/2018  . Hypertension associated with diabetes (HCC) 05/09/2018  . Compulsive behaviors 05/09/2018  . Diabetic eye exam (HCC) 05/09/2018  . History of shingles 12/21/2018  . Strain of rhomboid muscle 03/28/2019  . Seasonal allergies 08/01/2019  . Allergic conjunctivitis and rhinitis, bilateral 08/01/2019  . Scalp itch 09/27/2019  . Controlled type 2  diabetes mellitus with complication, without long-term current use of insulin (HCC) 09/27/2019  . Moderate episode of recurrent major depressive disorder (HCC) 12/27/2019  . Stress at home 03/29/2020   Resolved Ambulatory Problems    Diagnosis Date Noted  . No Resolved Ambulatory Problems   Past Medical History:  Diagnosis Date  . Allergy   . Anxiety   . Depression   . GERD (gastroesophageal reflux disease)   . Hypertension   . Obesity      Review of Systems See HPI.     Objective:   Physical Exam        Assessment & Plan:  Marland KitchenMarland KitchenDiagnoses and all orders for this visit:  Acute right-sided low back pain without sciatica -     ibuprofen (ADVIL) 800 MG tablet; Take 1 tablet (800 mg total) by mouth every 8 (eight) hours as needed. -     cyclobenzaprine (FLEXERIL) 5 MG tablet; Take 1 tablet (5 mg total) by mouth 3 (three) times daily as needed for muscle spasms. -     predniSONE (DELTASONE) 50 MG tablet; Take one tablet for 5 days. -     HYDROcodone-acetaminophen (NORCO/VICODIN) 5-325 MG tablet; Take 1 tablet by mouth every 8 (eight) hours as needed for up to 5 days for moderate pain. -     ketorolac (TORADOL) injection 60 mg   .Marland KitchenPDMP reviewed during this encounter. No concerns. No controlled pain medication since 2020.  5 days supply small quanity to use for break through pain.  Toradol 60mg  IM given in office today.  No red flag signs and symptoms today.  Burst of prednisone.  Flexeril as needed.  Continue ibuprofen 800mg  up to three times a day.  Low back exercises.  Consider massage.  Ice and heat.  Consider tens unit.  Follow up with any worsening pain.

## 2020-05-22 ENCOUNTER — Encounter: Payer: Self-pay | Admitting: Physician Assistant

## 2020-06-19 ENCOUNTER — Other Ambulatory Visit: Payer: Self-pay | Admitting: Physician Assistant

## 2020-06-25 ENCOUNTER — Ambulatory Visit (INDEPENDENT_AMBULATORY_CARE_PROVIDER_SITE_OTHER): Payer: BC Managed Care – PPO | Admitting: Physician Assistant

## 2020-06-25 DIAGNOSIS — Z5329 Procedure and treatment not carried out because of patient's decision for other reasons: Secondary | ICD-10-CM

## 2020-06-25 DIAGNOSIS — E118 Type 2 diabetes mellitus with unspecified complications: Secondary | ICD-10-CM

## 2020-06-25 NOTE — Progress Notes (Signed)
No show

## 2020-07-02 ENCOUNTER — Other Ambulatory Visit: Payer: Self-pay

## 2020-07-02 ENCOUNTER — Ambulatory Visit (INDEPENDENT_AMBULATORY_CARE_PROVIDER_SITE_OTHER): Payer: BC Managed Care – PPO | Admitting: Physician Assistant

## 2020-07-02 ENCOUNTER — Encounter: Payer: Self-pay | Admitting: Physician Assistant

## 2020-07-02 VITALS — BP 128/74 | HR 112 | Ht 64.0 in | Wt 313.0 lb

## 2020-07-02 DIAGNOSIS — E66813 Obesity, class 3: Secondary | ICD-10-CM

## 2020-07-02 DIAGNOSIS — I10 Essential (primary) hypertension: Secondary | ICD-10-CM | POA: Diagnosis not present

## 2020-07-02 DIAGNOSIS — R7303 Prediabetes: Secondary | ICD-10-CM

## 2020-07-02 DIAGNOSIS — Z8639 Personal history of other endocrine, nutritional and metabolic disease: Secondary | ICD-10-CM

## 2020-07-02 DIAGNOSIS — Z6841 Body Mass Index (BMI) 40.0 and over, adult: Secondary | ICD-10-CM

## 2020-07-02 LAB — POCT GLYCOSYLATED HEMOGLOBIN (HGB A1C): Hemoglobin A1C: 5.7 % — AB (ref 4.0–5.6)

## 2020-07-02 MED ORDER — VALSARTAN-HYDROCHLOROTHIAZIDE 320-25 MG PO TABS: 1.0000 | ORAL_TABLET | Freq: Every day | ORAL | 1 refills | Status: DC

## 2020-07-02 NOTE — Progress Notes (Signed)
Subjective:    Patient ID: Karen Stokes, female    DOB: [birth year], 35 y.o.   MRN: [id number]  HPI Pt is a 35 yo obese female with hx of diabetes, HTN, HLD who presents to the clinic to follow up on glucose.   Pt is trying to manage glucose with diet and weight loss. She is down another 8lbs from 3 months ago. She is not on any medication for this. She is checking her sugars at home and fasting around 100-110. She denies any open sores, wounds, hypoglycemia, increased thirst. She is eating keto diet and trying to stay active.   No CP, palpitations, headaches or vision changes.   .. Active Ambulatory Problems    Diagnosis Date Noted  . Eosinophilic esophagitis   . Irritable bowel syndrome   . Palpitations 02/01/2018  . Class 3 severe obesity due to excess calories with serious comorbidity and body mass index (BMI) of 50.0 to 59.9 in adult (HCC) 02/01/2018  . Uncontrolled stage 2 hypertension 02/01/2018  . Acute right-sided low back pain without sciatica 02/01/2018  . Dyspnea on exertion 02/01/2018  . GAD (generalized anxiety disorder) 02/01/2018  . Severe episode of recurrent major depressive disorder, without psychotic features (HCC) 02/01/2018  . Intractable migraine without aura and without status migrainosus 03/17/2018  . Encounter for routine checking of intrauterine contraceptive device (IUD) 03/17/2018  . Hyperlipidemia associated with type 2 diabetes mellitus (HCC) 05/09/2018  . Hypertension associated with diabetes (HCC) 05/09/2018  . Compulsive behaviors 05/09/2018  . Diabetic eye exam (HCC) 05/09/2018  . History of shingles 12/21/2018  . Strain of rhomboid muscle 03/28/2019  . Seasonal allergies 08/01/2019  . Allergic conjunctivitis and rhinitis, bilateral 08/01/2019  . Scalp itch 09/27/2019  . Moderate episode of recurrent major depressive disorder (HCC) 12/27/2019  . Stress at home 03/29/2020   Resolved Ambulatory Problems    Diagnosis Date Noted  . Uncontrolled  type 2 diabetes mellitus with hyperglycemia (HCC) 02/13/2018   Past Medical History:  Diagnosis Date  . Allergy   . Anxiety   . Depression   . GERD (gastroesophageal reflux disease)   . Hypertension   . Obesity       Review of Systems  All other systems reviewed and are negative.      Objective:   Physical Exam Vitals reviewed.  Constitutional:      Appearance: Normal appearance. She is obese.  Cardiovascular:     Rate and Rhythm: Normal rate and regular rhythm.     Pulses: Normal pulses.  Pulmonary:     Effort: Pulmonary effort is normal.     Breath sounds: Normal breath sounds.  Neurological:     Mental Status: She is alert.  Psychiatric:        Mood and Affect: Mood normal.           Assessment & Plan:  Marland KitchenMarland KitchenDeirdra was seen today for diabetes.  Diagnoses and all orders for this visit:  History of diabetes mellitus -     POCT glycosylated hemoglobin (Hb A1C)  Pre-diabetes -     POCT glycosylated hemoglobin (Hb A1C)  Class 3 severe obesity due to excess calories with serious comorbidity and body mass index (BMI) of 50.0 to 59.9 in adult [facility])  Primary hypertension -     valsartan-hydrochlorothiazide (DIOVAN-HCT) 320-25 MG tablet; Take 1 tablet by mouth daily.   A1C in pre-diabetes but out of Diabetes at 5.7.  Changed problem list.  No meds! On ARB. BP to goal.  On statin.  Vaccines UTD.  Follow up in 6 months.   Continue with weight loss. Goal under 300 at next 6 month follow up.

## 2020-07-08 ENCOUNTER — Encounter: Payer: Self-pay | Admitting: Physician Assistant

## 2020-07-09 ENCOUNTER — Other Ambulatory Visit: Payer: Self-pay

## 2020-07-09 ENCOUNTER — Ambulatory Visit (INDEPENDENT_AMBULATORY_CARE_PROVIDER_SITE_OTHER): Payer: BC Managed Care – PPO | Admitting: Physician Assistant

## 2020-07-09 ENCOUNTER — Encounter: Payer: Self-pay | Admitting: Physician Assistant

## 2020-07-09 VITALS — BP 122/79 | HR 108 | Ht 64.0 in | Wt 316.0 lb

## 2020-07-09 DIAGNOSIS — L299 Pruritus, unspecified: Secondary | ICD-10-CM

## 2020-07-09 DIAGNOSIS — H9202 Otalgia, left ear: Secondary | ICD-10-CM | POA: Diagnosis not present

## 2020-07-09 MED ORDER — METHYLPREDNISOLONE SODIUM SUCC 125 MG IJ SOLR
125.0000 mg | Freq: Once | INTRAMUSCULAR | Status: AC
  Administered 2020-07-09: 125 mg via INTRAMUSCULAR

## 2020-07-09 MED ORDER — TRIAMCINOLONE ACETONIDE 0.1 % EX CREA: 1.0000 "application " | TOPICAL_CREAM | Freq: Two times a day (BID) | CUTANEOUS | 0 refills | Status: DC

## 2020-07-09 NOTE — Progress Notes (Signed)
   Subjective:    Patient ID: Karen Stokes, female    DOB: May 04, 1985, 35 y.o.   MRN: [id number]  HPI  Patient is a 35 year old female who presents to the clinic with left ear pain/vibrating/itching for the last 4 days.  It started just a tickle in the ear and progressed to more of a vibratory sensation.  Denies any discharge, fever, chills, sinus pressure, cough.  She cannot reproduce any pain.  Her mother has looked in her ear not seeing anything.  She keeps trying to see if there is something tickling her ear.  She has never had anything like this before.  She does have some ear popping.  She cannot use nasal sprays because they cause nosebleeds.  She has not had any medication changes.  .. Active Ambulatory Problems    Diagnosis Date Noted  . Eosinophilic esophagitis   . Irritable bowel syndrome   . Palpitations 02/01/2018  . Class 3 severe obesity due to excess calories with serious comorbidity and body mass index (BMI) of 50.0 to 59.9 in adult (HCC) 02/01/2018  . Uncontrolled stage 2 hypertension 02/01/2018  . Acute right-sided low back pain without sciatica 02/01/2018  . Dyspnea on exertion 02/01/2018  . GAD (generalized anxiety disorder) 02/01/2018  . Severe episode of recurrent major depressive disorder, without psychotic features (HCC) 02/01/2018  . Intractable migraine without aura and without status migrainosus 03/17/2018  . Encounter for routine checking of intrauterine contraceptive device (IUD) 03/17/2018  . Hyperlipidemia associated with type 2 diabetes mellitus (HCC) 05/09/2018  . Hypertension associated with diabetes (HCC) 05/09/2018  . Compulsive behaviors 05/09/2018  . Diabetic eye exam (HCC) 05/09/2018  . History of shingles 12/21/2018  . Strain of rhomboid muscle 03/28/2019  . Seasonal allergies 08/01/2019  . Allergic conjunctivitis and rhinitis, bilateral 08/01/2019  . Scalp itch 09/27/2019  . Moderate episode of recurrent major depressive disorder (HCC)  12/27/2019  . Stress at home 03/29/2020   Resolved Ambulatory Problems    Diagnosis Date Noted  . Uncontrolled type 2 diabetes mellitus with hyperglycemia (HCC) 02/13/2018   Past Medical History:  Diagnosis Date  . Allergy   . Anxiety   . Depression   . GERD (gastroesophageal reflux disease)   . Hypertension   . Obesity       Review of Systems See HPI.     Objective:   Physical Exam  Right ear: TM normal. Normal external canal. No cerumen.  Left ear: TM normal. Normal external canal. No cerumen.       Assessment & Plan:  Marland KitchenMarland KitchenShawnette was seen today for ear problem.  Diagnoses and all orders for this visit:  Left ear pain -     methylPREDNISolone sodium succinate (SOLU-MEDROL) 125 mg/2 mL injection 125 mg  Itching of ear -     triamcinolone (KENALOG) 0.1 %; Apply 1 application topically 2 (two) times daily.   Pain is more like a vibration/itching. Unclear etiology. No signs of infection. I did pull a hair out of external canal. Will try shot of solumedrol for any inflammation or ETD causing symptoms and gave some topical steroid to put over itching vibration. Consider cool compresses. Do not put anything in ear. Continue with seasonal allergy treatment.

## 2020-07-10 ENCOUNTER — Encounter: Payer: Self-pay | Admitting: Physician Assistant

## 2020-07-10 DIAGNOSIS — L299 Pruritus, unspecified: Secondary | ICD-10-CM

## 2020-07-10 DIAGNOSIS — H938X2 Other specified disorders of left ear: Secondary | ICD-10-CM

## 2020-07-16 ENCOUNTER — Other Ambulatory Visit: Payer: Self-pay | Admitting: Physician Assistant

## 2020-07-16 DIAGNOSIS — F411 Generalized anxiety disorder: Secondary | ICD-10-CM

## 2020-07-16 DIAGNOSIS — F332 Major depressive disorder, recurrent severe without psychotic features: Secondary | ICD-10-CM

## 2020-07-16 NOTE — Addendum Note (Signed)
Addended by: Jomarie Longs on: 07/16/2020 09:01 AM   Modules accepted: Orders

## 2020-07-18 DIAGNOSIS — M62838 Other muscle spasm: Secondary | ICD-10-CM | POA: Diagnosis not present

## 2020-07-18 DIAGNOSIS — M542 Cervicalgia: Secondary | ICD-10-CM | POA: Diagnosis not present

## 2020-08-14 ENCOUNTER — Ambulatory Visit (INDEPENDENT_AMBULATORY_CARE_PROVIDER_SITE_OTHER): Payer: BC Managed Care – PPO | Admitting: Physician Assistant

## 2020-08-14 ENCOUNTER — Encounter: Payer: Self-pay | Admitting: Physician Assistant

## 2020-08-14 ENCOUNTER — Other Ambulatory Visit: Payer: Self-pay

## 2020-08-14 VITALS — BP 114/71 | HR 98 | Temp 98.6°F | Ht 64.0 in | Wt 315.0 lb

## 2020-08-14 DIAGNOSIS — H1045 Other chronic allergic conjunctivitis: Secondary | ICD-10-CM | POA: Diagnosis not present

## 2020-08-14 DIAGNOSIS — Z9109 Other allergy status, other than to drugs and biological substances: Secondary | ICD-10-CM

## 2020-08-14 DIAGNOSIS — J302 Other seasonal allergic rhinitis: Secondary | ICD-10-CM

## 2020-08-14 MED ORDER — MONTELUKAST SODIUM 10 MG PO TABS: 10.0000 mg | ORAL_TABLET | Freq: Every day | ORAL | 3 refills | Status: DC

## 2020-08-14 NOTE — Patient Instructions (Signed)
Chlor-tab as needed for allergies.  Continue claritin and optivar and add singulair.

## 2020-08-14 NOTE — Progress Notes (Addendum)
Subjective:    Patient ID: Karen Stokes, female    DOB: 1985/04/29, 35 y.o.   MRN: [id number]  HPI  Patient is a 35 year old female who presents to the clinic with seasonal allergies. She has had environmental allergies for years, but has noticed them worsening in the last few weeks. She is experiencing multiple sneeze "attacks" a day, conjunctivitis, and rhinitis. At times, she feels it is difficult to fully take a breath, but denies any feelings of chest tightness or wheezing. She has tried [employer] adviser, Xyzal, Zyrtec and now is taking 2 Claritin in the morning, 2 Benadryl at night, and eye drops. She is unable to use nasal sprays due to causing nose bleeds, but has been using a gel. She has never been to an allergy specialist or had allergy testing in the past.    .. Active Ambulatory Problems    Diagnosis Date Noted  . Eosinophilic esophagitis   . Irritable bowel syndrome   . Palpitations 02/01/2018  . Class 3 severe obesity due to excess calories with serious comorbidity and body mass index (BMI) of 50.0 to 59.9 in adult (HCC) 02/01/2018  . Uncontrolled stage 2 hypertension 02/01/2018  . Acute right-sided low back pain without sciatica 02/01/2018  . Dyspnea on exertion 02/01/2018  . GAD (generalized anxiety disorder) 02/01/2018  . Severe episode of recurrent major depressive disorder, without psychotic features (HCC) 02/01/2018  . Intractable migraine without aura and without status migrainosus 03/17/2018  . Encounter for routine checking of intrauterine contraceptive device (IUD) 03/17/2018  . Hyperlipidemia associated with type 2 diabetes mellitus (HCC) 05/09/2018  . Hypertension associated with diabetes (HCC) 05/09/2018  . Compulsive behaviors 05/09/2018  . Diabetic eye exam (HCC) 05/09/2018  . History of shingles 12/21/2018  . Strain of rhomboid muscle 03/28/2019  . Seasonal allergies 08/01/2019  . Allergic conjunctivitis and rhinitis, bilateral 08/01/2019  . Scalp itch  09/27/2019  . Moderate episode of recurrent major depressive disorder (HCC) 12/27/2019  . Stress at home 03/29/2020   Resolved Ambulatory Problems    Diagnosis Date Noted  . Uncontrolled type 2 diabetes mellitus with hyperglycemia (HCC) 02/13/2018   Past Medical History:  Diagnosis Date  . Allergy   . Anxiety   . Depression   . GERD (gastroesophageal reflux disease)   . Hypertension   . Obesity       Review of Systems  HENT: Positive for congestion, nosebleeds, rhinorrhea and sneezing. Negative for sore throat.   Eyes: Positive for redness and itching.  Respiratory: Positive for cough and shortness of breath. Negative for chest tightness and wheezing.   Allergic/Immunologic: Positive for environmental allergies.       Objective:   Physical Exam Vitals reviewed.  Constitutional:      Appearance: Normal appearance. She is obese.  HENT:     Head: Normocephalic and atraumatic.     Nose: Rhinorrhea present.  Cardiovascular:     Rate and Rhythm: Normal rate and regular rhythm.     Pulses: Normal pulses.     Heart sounds: Normal heart sounds.  Pulmonary:     Effort: Pulmonary effort is normal.     Breath sounds: Normal breath sounds.  Musculoskeletal:     Cervical back: Normal range of motion and neck supple. No tenderness.  Neurological:     Mental Status: She is alert.          Assessment & Plan:  Marland KitchenMarland KitchenRasa was seen today for allergies.  Diagnoses and all orders for this visit:  Seasonal  allergies -     Ambulatory referral to Allergy -     montelukast (SINGULAIR) 10 MG tablet; Take 1 tablet (10 mg total) by mouth at bedtime.  Environmental allergies -     Ambulatory referral to Allergy -     montelukast (SINGULAIR) 10 MG tablet; Take 1 tablet (10 mg total) by mouth at bedtime.  Other chronic allergic conjunctivitis of both eyes -     Ambulatory referral to Allergy -     montelukast (SINGULAIR) 10 MG tablet; Take 1 tablet (10 mg total) by mouth at  bedtime.   Referral to allergy specialist for testing. She wants to have testing done in a few months when her allergies are better controlled.   Continue Claritin in the morning and add Singulair at night. Continue Benadryl until allergies are better controlled and then discontinue. Consider PRN chlortab. Use flonase 2 sprays each nostril daily. optivar for allergic conjunctivitis.   Discussed lifestyle modifications to decrease severity, such as not driving with windows down, showering after being outdoors, etc..  Patient declined steroid injection today and will try to control with medications.  Marland KitchenHarlon Flor PA-C, have reviewed and agree with the above documentation in it's entirety.

## 2020-09-02 ENCOUNTER — Encounter: Payer: Self-pay | Admitting: Physician Assistant

## 2020-09-04 LAB — HM DIABETES EYE EXAM

## 2020-09-05 ENCOUNTER — Other Ambulatory Visit: Payer: Self-pay | Admitting: Physician Assistant

## 2020-09-05 DIAGNOSIS — F331 Major depressive disorder, recurrent, moderate: Secondary | ICD-10-CM

## 2020-09-18 ENCOUNTER — Other Ambulatory Visit: Payer: Self-pay | Admitting: Physician Assistant

## 2020-09-18 DIAGNOSIS — F331 Major depressive disorder, recurrent, moderate: Secondary | ICD-10-CM

## 2020-10-12 ENCOUNTER — Other Ambulatory Visit: Payer: Self-pay

## 2020-10-12 ENCOUNTER — Emergency Department (INDEPENDENT_AMBULATORY_CARE_PROVIDER_SITE_OTHER)
Admission: EM | Admit: 2020-10-12 | Discharge: 2020-10-12 | Disposition: A | Discharge status: Home / Self Care | Payer: BC Managed Care – PPO | Source: Home / Self Care | Attending: Family Medicine | Admitting: Family Medicine

## 2020-10-12 DIAGNOSIS — M5431 Sciatica, right side: Secondary | ICD-10-CM | POA: Diagnosis not present

## 2020-10-12 DIAGNOSIS — M5442 Lumbago with sciatica, left side: Secondary | ICD-10-CM

## 2020-10-12 MED ORDER — METHYLPREDNISOLONE 4 MG PO TBPK: ORAL_TABLET | ORAL | 0 refills | Status: DC

## 2020-10-12 MED ORDER — CYCLOBENZAPRINE HCL 5 MG PO TABS: 5.0000 mg | ORAL_TABLET | Freq: Three times a day (TID) | ORAL | 0 refills | Status: DC | PRN

## 2020-10-12 MED ORDER — HYDROCODONE-ACETAMINOPHEN 7.5-325 MG PO TABS: 1.0000 | ORAL_TABLET | Freq: Four times a day (QID) | ORAL | 0 refills | Status: DC | PRN

## 2020-10-12 MED ORDER — KETOROLAC TROMETHAMINE 60 MG/2ML IM SOLN
60.0000 mg | Freq: Once | INTRAMUSCULAR | Status: AC
  Administered 2020-10-12: 60 mg via INTRAMUSCULAR

## 2020-10-12 NOTE — ED Provider Notes (Signed)
[name]    CSN: [id number] Arrival date & time: 10/12/20  0174      History   Chief Complaint Chief Complaint  Patient presents with   Back Pain    HPI Esmay Amspacher is a 35 y.o. female.   HPI  Patient states that she works from home.  She states that she has spent "too much time sitting" over the last couple of days.  She has developed severe pain in her left low back that radiates from her buttock to her foot.  No numbness.  No weakness.  No fall or injury.  No bowel or bladder complaint.  No prior problems with any disc or back problems  Past Medical History:  Diagnosis Date   Allergy    Anxiety    Depression    Eosinophilic esophagitis    GERD (gastroesophageal reflux disease)    Hypertension    Irritable bowel syndrome    Obesity     Patient Active Problem List   Diagnosis Date Noted   Stress at home 03/29/2020   Moderate episode of recurrent major depressive disorder (HCC) 12/27/2019   Scalp itch 09/27/2019   Seasonal allergies 08/01/2019   Allergic conjunctivitis and rhinitis, bilateral 08/01/2019   Strain of rhomboid muscle 03/28/2019   History of shingles 12/21/2018   Hyperlipidemia associated with type 2 diabetes mellitus (HCC) 05/09/2018   Hypertension associated with diabetes (HCC) 05/09/2018   Compulsive behaviors 05/09/2018   Diabetic eye exam (HCC) 05/09/2018   Intractable migraine without aura and without status migrainosus 03/17/2018   Encounter for routine checking of intrauterine contraceptive device (IUD) 03/17/2018   Palpitations 02/01/2018   Class 3 severe obesity due to excess calories with serious comorbidity and body mass index (BMI) of 50.0 to 59.9 in adult (HCC) 02/01/2018   Uncontrolled stage 2 hypertension 02/01/2018   Acute right-sided low back pain without sciatica 02/01/2018   Dyspnea on exertion 02/01/2018   GAD (generalized anxiety disorder) 02/01/2018   Severe episode of recurrent major depressive disorder,  without psychotic features (HCC) 02/01/2018   Eosinophilic esophagitis    Irritable bowel syndrome     Past Surgical History:  Procedure Laterality Date   CHOLECYSTECTOMY     ESOPHAGEAL DILATION     TONSILLECTOMY      OB History     Gravida  3   Para  1   Term      Preterm      AB  2   Living  1      SAB      IAB  2   Ectopic      Multiple      Live Births               Home Medications    Prior to Admission medications   Medication Sig Start Date End Date Taking? Authorizing Provider  cyclobenzaprine (FLEXERIL) 5 MG tablet Take 1 tablet (5 mg total) by mouth 3 (three) times daily as needed for muscle spasms. 10/12/20  Yes Eustace Moore, MD  HYDROcodone-acetaminophen [facility]) 7.5-325 MG tablet Take 1 tablet by mouth every 6 (six) hours as needed for moderate pain. 10/12/20  Yes Eustace Moore, MD  methylPREDNISolone (MEDROL DOSEPAK) 4 MG TBPK tablet tad 10/12/20  Yes Eustace Moore, MD  atorvastatin (LIPITOR) 40 MG tablet Take 1 tablet (40 mg total) by mouth daily. 01/29/20   Breeback, Jade L, PA-C  azelastine (OPTIVAR) 0.05 % ophthalmic solution Place 1 drop into  both eyes 2 (two) times daily. 08/01/19   Breeback, Jade L, PA-C  buPROPion (WELLBUTRIN) 100 MG tablet TAKE 1 TABLET BY MOUTH TWICE A DAY 09/18/20   Breeback, Jade L, PA-C  clonazePAM (KLONOPIN) 0.5 MG tablet Take 1 tablet (0.5 mg total) by mouth 2 (two) times daily as needed for anxiety. 03/27/20   Breeback, Lonna Cobb, PA-C  diphenhydrAMINE (BENADRYL) 25 MG tablet Take 25 mg by mouth at bedtime.    [provider]  FLUoxetine (PROZAC) 40 MG capsule TAKE 1 CAPSULE BY MOUTH  DAILY 07/17/20   Breeback, Jade L, PA-C  gabapentin (NEURONTIN) 100 MG capsule Take 1- 3 tablets at bedtime. 09/27/19   Breeback, Jade L, PA-C  ibuprofen (ADVIL) 800 MG tablet Take 1 tablet (800 mg total) by mouth every 8 (eight) hours as needed. 05/21/20   Breeback, Jade L, PA-C  loratadine (CLARITIN) 10 MG tablet Take 20  mg by mouth daily.    [provider]  montelukast (SINGULAIR) 10 MG tablet Take 1 tablet (10 mg total) by mouth at bedtime. 08/14/20   Breeback, Jade L, PA-C  saline (AYR) GEL Place 1 application into both nostrils.    [provider]  valsartan-hydrochlorothiazide (DIOVAN-HCT) 320-25 MG tablet Take 1 tablet by mouth daily. 07/02/20   Jomarie Longs, PA-C    Family History Family History  Problem Relation Age of Onset   Irritable bowel syndrome Mother    Anxiety disorder Mother    Depression Mother    Cirrhosis Father    Hypertension Maternal Grandfather    Heart attack Maternal Grandfather    Stroke Maternal Grandfather     Social History Social History   Tobacco Use   Smoking status: Never   Smokeless tobacco: Never  Vaping Use   Vaping Use: Never used  Substance Use Topics   Alcohol use: Never   Drug use: Never     Allergies   Amoxicillin-pot clavulanate, Metformin and related, Morphine and related, and Trileptal [oxcarbazepine]   Review of Systems Review of Systems See HPI  Physical Exam Triage Vital Signs ED Triage Vitals  Enc Vitals Group     BP 10/12/20 0858 114/81     Pulse Rate 10/12/20 0858 (!) 103     Resp 10/12/20 0858 20     Temp 10/12/20 0858 98.7 F (37.1 C)     Temp Source 10/12/20 0858 Oral     SpO2 10/12/20 0858 98 %     Weight 10/12/20 0850 (!) 365 lb (165.6 kg)     Height 10/12/20 0850 5\' 4"  (1.626 m)     Head Circumference --      Peak Flow --      Pain Score 10/12/20 0850 10     Pain Loc --      Pain Edu? --      Excl. in GC? --    No data found.  Updated Vital Signs BP 114/81   Pulse (!) 103   Temp 98.7 F (37.1 C) (Oral)   Resp 20   Ht 5\' 4"  (1.626 m)   Wt (!) 165.6 kg   LMP 09/30/2020 (Approximate)   SpO2 98%   BMI 62.65 kg/m      Physical Exam Constitutional:      General: She is not in acute distress.    Appearance: She is well-developed. She is obese.     Comments: Acutely uncomfortable.   Guarded movements  HENT:     Head: Normocephalic and atraumatic.  Nose:     Comments: Mask is in place Eyes:     Conjunctiva/sclera: Conjunctivae normal.     Pupils: Pupils are equal, round, and reactive to light.  Cardiovascular:     Rate and Rhythm: Normal rate.  Pulmonary:     Effort: Pulmonary effort is normal. No respiratory distress.  Abdominal:     General: There is no distension.     Palpations: Abdomen is soft.  Musculoskeletal:        General: Normal range of motion.     Cervical back: Normal range of motion.     Comments: There is tenderness over the left SI joint.  Mild increased muscle tone in the left lumbar column of muscles.  Very limited range of motion secondary to pain.  Antalgic gait.  Reflexes intact.  Sensation intact.  Straight leg raise is positive on the left at 30 degrees.  Straight leg raise on the right is negative.  Skin:    General: Skin is warm and dry.  Neurological:     Mental Status: She is alert.     UC Treatments / Results  Labs (all labs ordered are listed, but only abnormal results are displayed) Labs Reviewed - No data to display  EKG   Radiology No results found.  Procedures Procedures (including critical care time)  Medications Ordered in UC Medications  ketorolac (TORADOL) injection 60 mg (60 mg Intramuscular Given 10/12/20 0935)    Initial Impression / Assessment and Plan / UC Course  I have reviewed the triage vital signs and the nursing notes.  Pertinent labs & imaging results that were available during my care of the patient were reviewed by me and considered in my medical decision making (see chart for details).     Discussed conservative treatment of lumbar nerve root inflammation. Final Clinical Impressions(s) / UC Diagnoses   Final diagnoses:  Sciatica of right side  Acute left-sided low back pain with left-sided sciatica     Discharge Instructions      Activity as tolerated.  Avoid bending and lifting.   Resist temptation to stay in bed Ice or heat to area Take Medrol as directed.  Take all of day 1 today. Take Flexeril as needed muscle relaxer.  May take 2 at night Take Tylenol, ibuprofen, Aleve for moderate pain.  If pain is severe take hydrocodone.  Do not drive on hydrocodone If not improving by Tuesday, call your PCP to see about physical therapy   ED Prescriptions     Medication Sig Dispense Auth. Provider   methylPREDNISolone (MEDROL DOSEPAK) 4 MG TBPK tablet tad 21 tablet Eustace Moore, MD   cyclobenzaprine (FLEXERIL) 5 MG tablet Take 1 tablet (5 mg total) by mouth 3 (three) times daily as needed for muscle spasms. 30 tablet Eustace Moore, MD   HYDROcodone-acetaminophen [facility]) 7.5-325 MG tablet Take 1 tablet by mouth every 6 (six) hours as needed for moderate pain. 15 tablet Eustace Moore, MD      I have reviewed the PDMP during this encounter.   Eustace Moore, MD 10/12/20 [phone]

## 2020-10-12 NOTE — ED Triage Notes (Signed)
Pt presents to Urgent Care with c/o lower back pain that "shoots" to L leg since yesterday. 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

## 2020-10-12 NOTE — Discharge Instructions (Addendum)
Activity as tolerated.  Avoid bending and lifting.  Resist temptation to stay in bed Ice or heat to area Take Medrol as directed.  Take all of day 1 today. Take Flexeril as needed muscle relaxer.  May take 2 at night Take Tylenol, ibuprofen, Aleve for moderate pain.  If pain is severe take hydrocodone.  Do not drive on hydrocodone If not improving by Tuesday, call your PCP to see about physical therapy

## 2020-10-17 DIAGNOSIS — Z885 Allergy status to narcotic agent status: Secondary | ICD-10-CM | POA: Diagnosis not present

## 2020-10-17 DIAGNOSIS — M25552 Pain in left hip: Secondary | ICD-10-CM | POA: Diagnosis not present

## 2020-10-17 DIAGNOSIS — M62838 Other muscle spasm: Secondary | ICD-10-CM | POA: Diagnosis not present

## 2020-10-17 DIAGNOSIS — M79605 Pain in left leg: Secondary | ICD-10-CM | POA: Diagnosis not present

## 2020-10-17 DIAGNOSIS — M545 Low back pain, unspecified: Secondary | ICD-10-CM | POA: Diagnosis not present

## 2020-10-17 DIAGNOSIS — Z79899 Other long term (current) drug therapy: Secondary | ICD-10-CM | POA: Diagnosis not present

## 2020-10-17 DIAGNOSIS — K219 Gastro-esophageal reflux disease without esophagitis: Secondary | ICD-10-CM | POA: Diagnosis not present

## 2020-10-17 DIAGNOSIS — Z6841 Body Mass Index (BMI) 40.0 and over, adult: Secondary | ICD-10-CM | POA: Diagnosis not present

## 2020-10-17 DIAGNOSIS — Z881 Allergy status to other antibiotic agents status: Secondary | ICD-10-CM | POA: Diagnosis not present

## 2020-10-18 ENCOUNTER — Encounter: Payer: Self-pay | Admitting: Physician Assistant

## 2020-10-21 ENCOUNTER — Other Ambulatory Visit: Payer: Self-pay | Admitting: Physician Assistant

## 2020-10-21 DIAGNOSIS — F439 Reaction to severe stress, unspecified: Secondary | ICD-10-CM

## 2020-10-21 DIAGNOSIS — F411 Generalized anxiety disorder: Secondary | ICD-10-CM

## 2020-10-21 NOTE — Telephone Encounter (Signed)
Last written  03/27/2020 #60 no refills Last appt 08/14/2020

## 2020-10-22 ENCOUNTER — Encounter: Payer: Self-pay | Admitting: Physician Assistant

## 2020-10-22 ENCOUNTER — Ambulatory Visit (INDEPENDENT_AMBULATORY_CARE_PROVIDER_SITE_OTHER): Payer: BC Managed Care – PPO | Admitting: Physician Assistant

## 2020-10-22 ENCOUNTER — Other Ambulatory Visit: Payer: Self-pay

## 2020-10-22 VITALS — BP 122/62 | HR 110 | Ht 64.0 in | Wt 332.0 lb

## 2020-10-22 DIAGNOSIS — Z6841 Body Mass Index (BMI) 40.0 and over, adult: Secondary | ICD-10-CM | POA: Diagnosis not present

## 2020-10-22 DIAGNOSIS — R7303 Prediabetes: Secondary | ICD-10-CM

## 2020-10-22 DIAGNOSIS — E1165 Type 2 diabetes mellitus with hyperglycemia: Secondary | ICD-10-CM

## 2020-10-22 DIAGNOSIS — M5416 Radiculopathy, lumbar region: Secondary | ICD-10-CM | POA: Insufficient documentation

## 2020-10-22 LAB — POCT GLYCOSYLATED HEMOGLOBIN (HGB A1C): Hemoglobin A1C: 6.9 % — AB (ref 4.0–5.6)

## 2020-10-22 MED ORDER — METHYLPREDNISOLONE ACETATE 80 MG/ML IJ SUSP
80.0000 mg | Freq: Once | INTRAMUSCULAR | Status: AC
  Administered 2020-10-22: 80 mg via INTRAMUSCULAR

## 2020-10-22 MED ORDER — GABAPENTIN 300 MG PO CAPS: 300.0000 mg | ORAL_CAPSULE | Freq: Three times a day (TID) | ORAL | 0 refills | Status: DC

## 2020-10-22 MED ORDER — RYBELSUS 14 MG PO TABS: 1.0000 | ORAL_TABLET | Freq: Every day | ORAL | 0 refills | Status: DC

## 2020-10-22 MED ORDER — RYBELSUS 7 MG PO TABS: 1.0000 | ORAL_TABLET | Freq: Every day | ORAL | 0 refills | Status: DC

## 2020-10-22 MED ORDER — RYBELSUS 3 MG PO TABS: 1.0000 | ORAL_TABLET | Freq: Every day | ORAL | 0 refills | Status: DC

## 2020-10-22 NOTE — Progress Notes (Signed)
Subjective:    Patient ID: Karen Stokes, female    DOB: [birth year], 35 y.o.   MRN: [id number]  HPI Pt is a 35 yo obese female with T2DM, HTN, Migraines who presents to the clinic to discuss left leg pain and diabetes.   On 7/2 went to UC and given prednisone, flexeril and tramadol.   On 7/7 pt went to the ED with increasing pain. Xrays were normal. She was given toradol in ED and said to follow up with ortho. Denies any bowel or bladder dysfunction, saddle anesthesia, leg weakness. She is taking gabapentin 100mg  TID which is probably helping the most.   She continues to have severe pain radiating down left lateral leg and into left heel and left lateral foot. Worse with coughing or sneezing. She admits this occurred after trying a new sexual position.   DM- for no known reason patient just stopped medications for diabetes. She has noticed her sugars are increasing. She had 160 fasting the other day. She needs to get back on medication. She does not want to do injectable. She would consider pills. No hypoglycemia events.   .. Active Ambulatory Problems    Diagnosis Date Noted   Eosinophilic esophagitis    Irritable bowel syndrome    Palpitations 02/01/2018   Class 3 severe obesity due to excess calories with serious comorbidity and body mass index (BMI) of 50.0 to 59.9 in adult (HCC) 02/01/2018   Uncontrolled stage 2 hypertension 02/01/2018   Acute right-sided low back pain without sciatica 02/01/2018   Dyspnea on exertion 02/01/2018   GAD (generalized anxiety disorder) 02/01/2018   Severe episode of recurrent major depressive disorder, without psychotic features (HCC) 02/01/2018   Uncontrolled type 2 diabetes mellitus with hyperglycemia (HCC) 02/13/2018   Intractable migraine without aura and without status migrainosus 03/17/2018   Encounter for routine checking of intrauterine contraceptive device (IUD) 03/17/2018   Hyperlipidemia associated with type 2 diabetes mellitus (HCC)  05/09/2018   Hypertension associated with diabetes (HCC) 05/09/2018   Compulsive behaviors 05/09/2018   Diabetic eye exam (HCC) 05/09/2018   History of shingles 12/21/2018   Strain of rhomboid muscle 03/28/2019   Seasonal allergies 08/01/2019   Allergic conjunctivitis and rhinitis, bilateral 08/01/2019   Scalp itch 09/27/2019   Moderate episode of recurrent major depressive disorder (HCC) 12/27/2019   Stress at home 03/29/2020   Left lumbar radiculitis 10/22/2020   Resolved Ambulatory Problems    Diagnosis Date Noted   No Resolved Ambulatory Problems   Past Medical History:  Diagnosis Date   Allergy    Anxiety    Depression    GERD (gastroesophageal reflux disease)    Hypertension    Obesity      Review of Systems See HPI>     Objective:   Physical Exam Vitals reviewed.  Constitutional:      Appearance: Normal appearance. She is obese.  HENT:     Head: Normocephalic.  Cardiovascular:     Rate and Rhythm: Regular rhythm. Tachycardia present.  Pulmonary:     Effort: Pulmonary effort is normal.     Breath sounds: Normal breath sounds.  Musculoskeletal:     Comments: Limited ROM at waist due to pain.  Left paraspinal tenderness.  Straight leg raise with numbness/tingling/pain at 30 degrees.   Neurological:     General: No focal deficit present.     Mental Status: She is alert and oriented to person, place, and time.  Psychiatric:  Mood and Affect: Mood normal.      .. Results for orders placed or performed in visit on 10/22/20  POCT glycosylated hemoglobin (Hb A1C)  Result Value Ref Range   Hemoglobin A1C 6.9 (A) 4.0 - 5.6 %   HbA1c POC (<> result, manual entry)     HbA1c, POC (prediabetic range)     HbA1c, POC (controlled diabetic range)         Assessment & Plan:  Marland KitchenMarland KitchenLynore was seen today for diabetes and spasms.  Diagnoses and all orders for this visit:  Left lumbar radiculitis -     gabapentin (NEURONTIN) 300 MG capsule; Take 1 capsule (300  mg total) by mouth 3 (three) times daily. -     Ambulatory referral to Physical Therapy -     methylPREDNISolone acetate (DEPO-MEDROL) injection 80 mg  Uncontrolled type 2 diabetes mellitus with hyperglycemia (HCC) -     POCT glycosylated hemoglobin (Hb A1C) -     Semaglutide (RYBELSUS) 14 MG TABS; Take 1 tablet by mouth daily. On a empty stomach with no more than 4oz of water. -     Semaglutide (RYBELSUS) 7 MG TABS; Take 1 tablet by mouth daily. On a empty stomach with no more than 4oz of water. -     Semaglutide (RYBELSUS) 3 MG TABS; Take 1 tablet by mouth daily. On a empty stomach with no more than 4oz of water.  Class 3 severe obesity due to excess calories with serious comorbidity and body mass index (BMI) of 50.0 to 59.9 in adult [facility])  Sounds like more discogenic pain in the L5/S1 distribution.  Pt would benefit from PT. Placed referral.  Depo medrol given in office today. Finished medrol dose pack.  Increased gabapentin 300mg  TID.  Discussed no lifting/bending/twisting.  Follow up with ortho and as needed.   A1C is still under 7.  Need to start rybelsus.  Discussed how to take and side effects.  Discussed DM diet.  On statin.  Follow up in 3 months.

## 2020-10-22 NOTE — Patient Instructions (Addendum)
Gabapentin increase to 300mg  three times a day.   Low Back Sprain or Strain Rehab Ask your health care provider which exercises are safe for you. Do exercises exactly as told by your health care provider and adjust them as directed. It is normal to feel mild stretching, pulling, tightness, or discomfort as you do these exercises. Stop right away if you feel sudden pain or your pain gets worse. Do not begin these exercises until told by your health care provider. Stretching and range-of-motion exercises These exercises warm up your muscles and joints and improve the movement and flexibility of your back. These exercises also help to relieve pain, numbness,and tingling. Lumbar rotation  Lie on your back on a firm surface and bend your knees. Straighten your arms out to your sides so each arm forms a 90-degree angle (right angle) with a side of your body. Slowly move (rotate) both of your knees to one side of your body until you feel a stretch in your lower back (lumbar). Try not to let your shoulders lift off the floor. Hold this position for __________ seconds. Tense your abdominal muscles and slowly move your knees back to the starting position. Repeat this exercise on the other side of your body. Repeat __________ times. Complete this exercise __________ times a day. Single knee to chest  Lie on your back on a firm surface with both legs straight. Bend one of your knees. Use your hands to move your knee up toward your chest until you feel a gentle stretch in your lower back and buttock. Hold your leg in this position by holding on to the front of your knee. Keep your other leg as straight as possible. Hold this position for __________ seconds. Slowly return to the starting position. Repeat with your other leg. Repeat __________ times. Complete this exercise __________ times a day. Prone extension on elbows  Lie on your abdomen on a firm surface (prone position). Prop yourself up on your  elbows. Use your arms to help lift your chest up until you feel a gentle stretch in your abdomen and your lower back. This will place some of your body weight on your elbows. If this is uncomfortable, try stacking pillows under your chest. Your hips should stay down, against the surface that you are lying on. Keep your hip and back muscles relaxed. Hold this position for __________ seconds. Slowly relax your upper body and return to the starting position. Repeat __________ times. Complete this exercise __________ times a day. Strengthening exercises These exercises build strength and endurance in your back. Endurance is theability to use your muscles for a long time, even after they get tired. Pelvic tilt This exercise strengthens the muscles that lie deep in the abdomen. Lie on your back on a firm surface. Bend your knees and keep your feet flat on the floor. Tense your abdominal muscles. Tip your pelvis up toward the ceiling and flatten your lower back into the floor. To help with this exercise, you may place a small towel under your lower back and try to push your back into the towel. Hold this position for __________ seconds. Let your muscles relax completely before you repeat this exercise. Repeat __________ times. Complete this exercise __________ times a day. Alternating arm and leg raises  Get on your hands and knees on a firm surface. If you are on a hard floor, you may want to use padding, such as an exercise mat, to cushion your knees. Line up your arms  and legs. Your hands should be directly below your shoulders, and your knees should be directly below your hips. Lift your left leg behind you. At the same time, raise your right arm and straighten it in front of you. Do not lift your leg higher than your hip. Do not lift your arm higher than your shoulder. Keep your abdominal and back muscles tight. Keep your hips facing the ground. Do not arch your back. Keep your balance  carefully, and do not hold your breath. Hold this position for __________ seconds. Slowly return to the starting position. Repeat with your right leg and your left arm. Repeat __________ times. Complete this exercise __________ times a day. Abdominal set with straight leg raise  Lie on your back on a firm surface. Bend one of your knees and keep your other leg straight. Tense your abdominal muscles and lift your straight leg up, 4-6 inches (10-15 cm) off the ground. Keep your abdominal muscles tight and hold this position for __________ seconds. Do not hold your breath. Do not arch your back. Keep it flat against the ground. Keep your abdominal muscles tense as you slowly lower your leg back to the starting position. Repeat with your other leg. Repeat __________ times. Complete this exercise __________ times a day. Single leg lower with bent knees Lie on your back on a firm surface. Tense your abdominal muscles and lift your feet off the floor, one foot at a time, so your knees and hips are bent in 90-degree angles (right angles). Your knees should be over your hips and your lower legs should be parallel to the floor. Keeping your abdominal muscles tense and your knee bent, slowly lower one of your legs so your toe touches the ground. Lift your leg back up to return to the starting position. Do not hold your breath. Do not let your back arch. Keep your back flat against the ground. Repeat with your other leg. Repeat __________ times. Complete this exercise __________ times a day. Posture and body mechanics Good posture and healthy body mechanics can help to relieve stress in your body's tissues and joints. Body mechanics refers to the movements and positions of your body while you do your daily activities. Posture is part of body mechanics. Good posture means: Your spine is in its natural S-curve position (neutral). Your shoulders are pulled back slightly. Your head is not tipped  forward. Follow these guidelines to improve your posture and body mechanics in youreveryday activities. Standing  When standing, keep your spine neutral and your feet about hip width apart. Keep a slight bend in your knees. Your ears, shoulders, and hips should line up. When you do a task in which you stand in one place for a long time, place one foot up on a stable object that is 2-4 inches (5-10 cm) high, such as a footstool. This helps keep your spine neutral.  Sitting  When sitting, keep your spine neutral and keep your feet flat on the floor. Use a footrest, if necessary, and keep your thighs parallel to the floor. Avoid rounding your shoulders, and avoid tilting your head forward. When working at a desk or a computer, keep your desk at a height where your hands are slightly lower than your elbows. Slide your chair under your desk so you are close enough to maintain good posture. When working at a computer, place your monitor at a height where you are looking straight ahead and you do not have to tilt your  head forward or downward to look at the screen.  Resting When lying down and resting, avoid positions that are most painful for you. If you have pain with activities such as sitting, bending, stooping, or squatting, lie in a position in which your body does not bend very much. For example, avoid curling up on your side with your arms and knees near your chest (fetal position). If you have pain with activities such as standing for a long time or reaching with your arms, lie with your spine in a neutral position and bend your knees slightly. Try the following positions: Lying on your side with a pillow between your knees. Lying on your back with a pillow under your knees. Lifting  When lifting objects, keep your feet at least shoulder width apart and tighten your abdominal muscles. Bend your knees and hips and keep your spine neutral. It is important to lift using the strength of your  legs, not your back. Do not lock your knees straight out. Always ask for help to lift heavy or awkward objects.  This information is not intended to replace advice given to you by your health care provider. Make sure you discuss any questions you have with your healthcare provider. Document Revised: 07/22/2018 Document Reviewed: 04/21/2018 Elsevier Patient Education  2022 [employer].

## 2020-10-24 DIAGNOSIS — M5416 Radiculopathy, lumbar region: Secondary | ICD-10-CM | POA: Diagnosis not present

## 2020-10-24 DIAGNOSIS — M5136 Other intervertebral disc degeneration, lumbar region: Secondary | ICD-10-CM | POA: Diagnosis not present

## 2020-11-06 ENCOUNTER — Ambulatory Visit: Payer: BC Managed Care – PPO | Admitting: Allergy

## 2020-11-07 ENCOUNTER — Other Ambulatory Visit: Payer: Self-pay

## 2020-11-07 ENCOUNTER — Ambulatory Visit (INDEPENDENT_AMBULATORY_CARE_PROVIDER_SITE_OTHER): Payer: BC Managed Care – PPO | Admitting: Rehabilitative and Restorative Service Providers"

## 2020-11-07 DIAGNOSIS — M5440 Lumbago with sciatica, unspecified side: Secondary | ICD-10-CM

## 2020-11-07 DIAGNOSIS — M6281 Muscle weakness (generalized): Secondary | ICD-10-CM | POA: Diagnosis not present

## 2020-11-07 DIAGNOSIS — R29898 Other symptoms and signs involving the musculoskeletal system: Secondary | ICD-10-CM

## 2020-11-07 NOTE — Therapy (Addendum)
[facility] [place] [address] [place], [place], [address] Phone: [phone]   Fax:  [phone]  Physical Therapy Evaluation  Patient Details  Name: Karen Stokes MRN: [id number] Date of Birth: 03/30/1986 Referring Provider (PT): Tandy Gaw, [place]   Patient did not return to PT.  Please refer to initial evaluation for patient status. Thank you for the referral of this patient. Margretta Ditty, MPT   Encounter Date: 11/07/2020   PT End of Session - 11/07/20 1442     Visit Number 1    Number of Visits 12    Date for PT Re-Evaluation 12/19/20    Authorization Type BCBS    PT Start Time 1402    PT Stop Time 1450    PT Time Calculation (min) 48 min    Activity Tolerance Patient tolerated treatment well    Behavior During Therapy WFL for tasks assessed/performed             Past Medical History:  Diagnosis Date   Allergy    Anxiety    Depression    Eosinophilic esophagitis    GERD (gastroesophageal reflux disease)    Hypertension    Irritable bowel syndrome    Obesity     Past Surgical History:  Procedure Laterality Date   CHOLECYSTECTOMY     ESOPHAGEAL DILATION     TONSILLECTOMY      There were no vitals filed for this visit.    Subjective Assessment - 11/07/20 1359     Subjective The patient had sudden onset of L LE pain (pointing to gluteal region) noting spasms in the glut into the L leg on 10/11/20.  She had radiating pain into the L lateral leg.  She notes pain is somewhat improved with meds.  She notes the L toe sensation is not normal as compared to the R side.  She started back on her diabetic medications.    Pertinent History diabetes, h/o facial shingles -- uses neurontin,    Patient Stated Goals no pain in L hip    Currently in Pain? Yes    Pain Score 3     Pain Location Back    Pain Orientation Left    Pain Descriptors / Indicators Aching;Discomfort    Pain Type Acute pain    Pain  Radiating Towards L leg-- back to L glut region    Pain Onset 1 to 4 weeks ago    Pain Frequency Intermittent    Aggravating Factors  sitting    Pain Relieving Factors heating pad                [facility] PT Assessment - 11/07/20 1415       Assessment   Medical Diagnosis L lumbar radiculitis    Referring Provider (PT) Tandy Gaw, PA    Onset Date/Surgical Date 10/22/20    Hand Dominance Right      Precautions   Precautions None      Restrictions   Weight Bearing Restrictions No      Balance Screen   Has the patient fallen in the past 6 months No    Has the patient had a decrease in activity level because of a fear of falling?  No    Is the patient reluctant to leave their home because of a fear of falling?  No      Home [employer] information [employer] residence    Living Arrangements Spouse/significant other    Home Layout  One level      Prior Function   Level of Independence Independent    Vocation Full time employment    Vocation Requirements sitting/ working at home      Cognition   Overall Cognitive Status Within Functional Limits for tasks assessed      Observation/Other Assessments   Focus on Therapeutic Outcomes (FOTO)  31%      Sensation   Light Touch Appears Intact      ROM / Strength   AROM / PROM / Strength AROM;Strength      AROM   Overall AROM Comments --    AROM Assessment Site Lumbar    Lumbar Flexion 25% limitation    Lumbar Extension 50% limitation   increases pain   Lumbar - Right Side Bend 50% limitation   "good pain" stretching sensation L   Lumbar - Left Side Bend 50% limitation   increases pain   Lumbar - Right Rotation 25% limitation    Lumbar - Left Rotation 25% limitation      Strength   Overall Strength Deficits    Overall Strength Comments --    Strength Assessment Site Hip;Knee;Ankle    Right/Left Hip Right;Left    Right Hip Flexion 5/5    Left Hip Flexion 5/5    Right/Left Knee Right;Left    Right Knee  Flexion 5/5    Right Knee Extension 5/5    Left Knee Flexion 5/5    Left Knee Extension 5/5    Right/Left Ankle Right;Left    Right Ankle Dorsiflexion 5/5    Left Ankle Dorsiflexion 4+/5      Flexibility   Soft Tissue Assessment /Muscle Length yes    Hamstrings L side is tighter than R side for HS    Quadriceps quad stretch provokes pain in L posterior hip with overpressure    Piriformis equal bilaterally      Palpation   Spinal mobility pain with CPA lumbar spine    Palpation comment pain with palpation lumbar paraspinals, glut medius; pain with passiver IR of the LEs      Ambulation/Gait   Ambulation/Gait Yes    Gait Comments slowed pace entering clinic                        Objective measurements completed on examination: See above findings.       [facility] Adult PT Treatment/Exercise - 11/07/20 1415       Exercises   Exercises Knee/Hip;Other Exercises;Lumbar    Other Exercises  isometric hip flexion hooklying provokes pain, prone IR/ER of hips with pain with IR, noted tightness      Lumbar Exercises: Stretches   Active Hamstring Stretch Right;Left;30 seconds;2 reps    Single Knee to Chest Stretch Right;Left;1 rep;30 seconds    Quad Stretch Right;Left;1 rep;30 seconds    Other Lumbar Stretch Exercise attempted prone bringing R leg off edge of mat table to stretch L anterior hip *reproduces pain      Modalities   Modalities Electrical Stimulation;Moist Heat      Moist Heat Therapy   Number Minutes Moist Heat 10 Minutes    Moist Heat Location Lumbar Spine      Electrical Stimulation   Electrical Stimulation Location lumbar (left)    Electrical Stimulation Action interferential    Electrical Stimulation Parameters to tolerance    Electrical Stimulation Goals Pain  PT Education - 11/07/20 1441     Education Details HEP    Person(s) Educated Patient    Methods Explanation;Demonstration;Handout    Comprehension  Verbalized understanding;Returned demonstration                 PT Long Term Goals - 11/07/20 1442       PT LONG TERM GOAL #1   Title The patient will be independent with HEP.    Time 6    Period Weeks    Target Date 12/19/20      PT LONG TERM GOAL #2   Title The patient will improve functional status score from 31% up to 54%.    Time 6    Period Weeks    Target Date 12/19/20      PT LONG TERM GOAL #3   Title The patient will report no pain at rest.    Baseline 3/10    Time 6    Period Weeks    Target Date 12/19/20      PT LONG TERM GOAL #4   Title The patient will improve L ankle DF to 5/5.    Time 6    Period Weeks    Target Date 12/19/20                    Plan - 11/07/20 1752     Clinical Impression Statement The patient is a 35 yo female presenting to OP physical therapy with acute onset of L glut and hip pain beginning 10/11/20.  She presents with impairments of dec'd ROM, pain with lumbar L sidebending and extension, hypomobility lumbar spine, dec'd flexibility, and radicular symptoms.  The patient is limited in frequency due to high deductible plan.  Patient will benefit from PT to address deficits to return to baseline.    Personal Factors and Comorbidities Comorbidity 2    Comorbidities diabetes, obesity    Examination-Activity Limitations Lift;Locomotion Level;Squat;Stairs;Stand;Sit;Bend    Examination-Participation Restrictions Occupation;Community Activity;Meal Prep;Cleaning    Stability/Clinical Decision Making Stable/Uncomplicated    Clinical Decision Making Low    Rehab Potential Good    PT Frequency 1x / week    PT Duration 6 weeks    PT Treatment/Interventions ADLs/Self Care Home Management;Patient/family education;Manual techniques;Therapeutic exercise;Therapeutic activities;Dry needling;Traction;Taping;Gait training;Electrical Stimulation;Moist Heat    PT Next Visit Plan progress further HEP    PT Home Exercise Plan Access Code:  [email or web address]    Consulted and Agree with Plan of Care Patient             Patient will benefit from skilled therapeutic intervention in order to improve the following deficits and impairments:  Pain, Hypomobility, Impaired flexibility, Decreased strength, Increased fascial restricitons, Decreased range of motion, Decreased activity tolerance, Obesity  Visit Diagnosis: Acute left-sided low back pain with sciatica, sciatica laterality unspecified  Muscle weakness (generalized)  Other symptoms and signs involving the musculoskeletal system     Problem List Patient Active Problem List   Diagnosis Date Noted   Left lumbar radiculitis 10/22/2020   Stress at home 03/29/2020   Moderate episode of recurrent major depressive disorder (HCC) 12/27/2019   Scalp itch 09/27/2019   Seasonal allergies 08/01/2019   Allergic conjunctivitis and rhinitis, bilateral 08/01/2019   Strain of rhomboid muscle 03/28/2019   History of shingles 12/21/2018   Hyperlipidemia associated with type 2 diabetes mellitus (HCC) 05/09/2018   Hypertension associated with diabetes (HCC) 05/09/2018   Compulsive behaviors 05/09/2018   Diabetic eye exam (HCC) 05/09/2018  Intractable migraine without aura and without status migrainosus 03/17/2018   Encounter for routine checking of intrauterine contraceptive device (IUD) 03/17/2018   Uncontrolled type 2 diabetes mellitus with hyperglycemia (HCC) 02/13/2018   Palpitations 02/01/2018   Class 3 severe obesity due to excess calories with serious comorbidity and body mass index (BMI) of 50.0 to 59.9 in adult (HCC) 02/01/2018   Uncontrolled stage 2 hypertension 02/01/2018   Acute right-sided low back pain without sciatica 02/01/2018   Dyspnea on exertion 02/01/2018   GAD (generalized anxiety disorder) 02/01/2018   Severe episode of recurrent major depressive disorder, without psychotic features (HCC) 02/01/2018   Eosinophilic esophagitis    Irritable bowel syndrome      Kayia Billinger, PT 11/07/2020, 6:05 PM  [facility] [address] [place], [place], [address] Phone: [phone]   Fax:  [phone]  Name: Nickolas MadridBrynn Kolbeck MRN: [id number] Date of Birth: 11/23/1985

## 2020-11-07 NOTE — Patient Instructions (Signed)
Access Code: [email or web address] URL: [email or web address] Date: 11/07/2020 Prepared by: Margretta Ditty  Program Notes Walking in water as your schedule allows   Exercises Supine Single Knee to Chest Stretch - 2 x daily - 7 x weekly - 1 sets - 3 reps - 30 seconds hold Supine March - 2 x daily - 7 x weekly - 1 sets - 10 reps Hooklying Hamstring Stretch with Strap - 2 x daily - 7 x weekly - 1 sets - 3 reps - 30 seconds hold Prone Quadriceps Stretch with Strap - 2 x daily - 7 x weekly - 1 sets - 3 reps - 30 seconds hold Seated Table Piriformis Stretch - 2 x daily - 7 x weekly - 1 sets - 3 reps - 30 seconds hold

## 2020-11-14 ENCOUNTER — Encounter: Payer: BC Managed Care – PPO | Admitting: Rehabilitative and Restorative Service Providers"

## 2020-11-21 ENCOUNTER — Encounter: Payer: BC Managed Care – PPO | Admitting: Rehabilitative and Restorative Service Providers"

## 2020-11-28 ENCOUNTER — Encounter: Payer: BC Managed Care – PPO | Admitting: Physical Therapy

## 2020-11-30 ENCOUNTER — Other Ambulatory Visit: Payer: Self-pay | Admitting: Physician Assistant

## 2020-11-30 DIAGNOSIS — J302 Other seasonal allergic rhinitis: Secondary | ICD-10-CM

## 2020-11-30 DIAGNOSIS — Z9109 Other allergy status, other than to drugs and biological substances: Secondary | ICD-10-CM

## 2020-11-30 DIAGNOSIS — H1045 Other chronic allergic conjunctivitis: Secondary | ICD-10-CM

## 2020-12-02 ENCOUNTER — Other Ambulatory Visit: Payer: Self-pay | Admitting: Physician Assistant

## 2020-12-02 DIAGNOSIS — M5416 Radiculopathy, lumbar region: Secondary | ICD-10-CM

## 2020-12-03 ENCOUNTER — Other Ambulatory Visit: Payer: Self-pay | Admitting: Physician Assistant

## 2020-12-03 DIAGNOSIS — F411 Generalized anxiety disorder: Secondary | ICD-10-CM

## 2020-12-03 DIAGNOSIS — F439 Reaction to severe stress, unspecified: Secondary | ICD-10-CM

## 2020-12-03 NOTE — Telephone Encounter (Signed)
Last written 10/21/2020 #60 with no refills Last appt 10/22/2020

## 2020-12-26 ENCOUNTER — Encounter: Payer: Self-pay | Admitting: Emergency Medicine

## 2020-12-26 ENCOUNTER — Other Ambulatory Visit: Payer: Self-pay

## 2020-12-26 ENCOUNTER — Emergency Department (INDEPENDENT_AMBULATORY_CARE_PROVIDER_SITE_OTHER)
Admission: EM | Admit: 2020-12-26 | Discharge: 2020-12-26 | Disposition: A | Discharge status: Home / Self Care | Payer: BC Managed Care – PPO | Source: Home / Self Care | Attending: Family Medicine | Admitting: Family Medicine

## 2020-12-26 DIAGNOSIS — T24219A Burn of second degree of unspecified thigh, initial encounter: Secondary | ICD-10-CM

## 2020-12-26 DIAGNOSIS — L0291 Cutaneous abscess, unspecified: Secondary | ICD-10-CM

## 2020-12-26 DIAGNOSIS — T24211A Burn of second degree of right thigh, initial encounter: Secondary | ICD-10-CM

## 2020-12-26 DIAGNOSIS — T24212A Burn of second degree of left thigh, initial encounter: Secondary | ICD-10-CM

## 2020-12-26 MED ORDER — CEFDINIR 300 MG PO CAPS: 300.0000 mg | ORAL_CAPSULE | Freq: Two times a day (BID) | ORAL | 0 refills | Status: DC

## 2020-12-26 NOTE — ED Triage Notes (Signed)
Boil between vagina & rectum  Started on Monday  Pt used a heating pad on it since yesterday  States she has gone through 4 packs of gauze Purulent drainage

## 2020-12-26 NOTE — ED Provider Notes (Signed)
[name]    CSN: [id number] Arrival date & time: 12/26/20  1941      History   Chief Complaint Chief Complaint  Patient presents with   Burn   Recurrent Skin Infections    Vaginal area    HPI Karen Stokes is a 35 y.o. female.   HPI  I am familiar with patient from prior visit.  She states that she has a infection in her "private" area.  Vaginal area.  She has been using heating pad.  Apparently got too hot because now she has blisters or skin.  She is here for evaluation. Patient is a diabetic, last measured hemoglobin A1c is 6.9.  Postprandial blood sugar today was 115 Last measured BMI 62.6 She sat on a heating pad and it blistered her skin with partial-thickness burns The abscess is ruptured and is draining spontaneously  Past Medical History:  Diagnosis Date   Allergy    Anxiety    Depression    Eosinophilic esophagitis    GERD (gastroesophageal reflux disease)    Hypertension    Irritable bowel syndrome    Obesity     Patient Active Problem List   Diagnosis Date Noted   Left lumbar radiculitis 10/22/2020   Stress at home 03/29/2020   Moderate episode of recurrent major depressive disorder (HCC) 12/27/2019   Scalp itch 09/27/2019   Seasonal allergies 08/01/2019   Allergic conjunctivitis and rhinitis, bilateral 08/01/2019   Strain of rhomboid muscle 03/28/2019   History of shingles 12/21/2018   Hyperlipidemia associated with type 2 diabetes mellitus (HCC) 05/09/2018   Hypertension associated with diabetes (HCC) 05/09/2018   Compulsive behaviors 05/09/2018   Diabetic eye exam (HCC) 05/09/2018   Intractable migraine without aura and without status migrainosus 03/17/2018   Encounter for routine checking of intrauterine contraceptive device (IUD) 03/17/2018   Uncontrolled type 2 diabetes mellitus with hyperglycemia (HCC) 02/13/2018   Palpitations 02/01/2018   Class 3 severe obesity due to excess calories with serious comorbidity and body mass  index (BMI) of 50.0 to 59.9 in adult (HCC) 02/01/2018   Uncontrolled stage 2 hypertension 02/01/2018   Acute right-sided low back pain without sciatica 02/01/2018   Dyspnea on exertion 02/01/2018   GAD (generalized anxiety disorder) 02/01/2018   Severe episode of recurrent major depressive disorder, without psychotic features (HCC) 02/01/2018   Eosinophilic esophagitis    Irritable bowel syndrome     Past Surgical History:  Procedure Laterality Date   CHOLECYSTECTOMY     ESOPHAGEAL DILATION     TONSILLECTOMY      OB History     Gravida  3   Para  1   Term      Preterm      AB  2   Living  1      SAB      IAB  2   Ectopic      Multiple      Live Births               Home Medications    Prior to Admission medications   Medication Sig Start Date End Date Taking? Authorizing Provider  cefdinir (OMNICEF) 300 MG capsule Take 1 capsule (300 mg total) by mouth 2 (two) times daily. 12/26/20  Yes Eustace Moore, MD  atorvastatin (LIPITOR) 40 MG tablet Take 1 tablet (40 mg total) by mouth daily. 01/29/20   Breeback, Jade L, PA-C  azelastine (OPTIVAR) 0.05 % ophthalmic solution Place 1 drop into both  eyes 2 (two) times daily. 08/01/19   Breeback, Lonna Cobb, PA-C  buPROPion (WELLBUTRIN) 100 MG tablet TAKE 1 TABLET BY MOUTH TWICE A DAY 09/18/20   Breeback, Jade L, PA-C  clonazePAM (KLONOPIN) 0.5 MG tablet TAKE 1 TABLET BY MOUTH TWICE A DAY AS NEEDED FOR ANXIETY 12/04/20   Breeback, Jade L, PA-C  cyclobenzaprine (FLEXERIL) 5 MG tablet Take 1 tablet (5 mg total) by mouth 3 (three) times daily as needed for muscle spasms. 10/12/20   Eustace Moore, MD  diphenhydrAMINE (BENADRYL) 25 MG tablet Take 25 mg by mouth at bedtime.    [provider]  FLUoxetine (PROZAC) 40 MG capsule TAKE 1 CAPSULE BY MOUTH  DAILY 07/17/20   Breeback, Jade L, PA-C  gabapentin (NEURONTIN) 100 MG capsule Take 1- 3 tablets at bedtime. 09/27/19   Breeback, Jade L, PA-C  gabapentin (NEURONTIN) 300  MG capsule TAKE 1 CAPSULE BY MOUTH THREE TIMES A DAY 12/02/20   Breeback, Jade L, PA-C  HYDROcodone-acetaminophen (NORCO) 7.5-325 MG tablet Take 1 tablet by mouth every 6 (six) hours as needed for moderate pain. 10/12/20   Eustace Moore, MD  ibuprofen (ADVIL) 800 MG tablet Take 1 tablet (800 mg total) by mouth every 8 (eight) hours as needed. 05/21/20   Breeback, Jade L, PA-C  loratadine (CLARITIN) 10 MG tablet Take 20 mg by mouth daily.    [provider]  montelukast (SINGULAIR) 10 MG tablet TAKE 1 TABLET BY MOUTH EVERYDAY AT BEDTIME 12/02/20   Breeback, Jade L, PA-C  saline (AYR) GEL Place 1 application into both nostrils.    [provider]  Semaglutide (RYBELSUS) 14 MG TABS Take 1 tablet by mouth daily. On a empty stomach with no more than 4oz of water. 10/22/20   Breeback, Jade L, PA-C  Semaglutide (RYBELSUS) 3 MG TABS Take 1 tablet by mouth daily. On a empty stomach with no more than 4oz of water. 10/22/20   Breeback, Jade L, PA-C  Semaglutide (RYBELSUS) 7 MG TABS Take 1 tablet by mouth daily. On a empty stomach with no more than 4oz of water. 10/22/20   Breeback, Jade L, PA-C  valsartan-hydrochlorothiazide (DIOVAN-HCT) 320-25 MG tablet Take 1 tablet by mouth daily. 07/02/20   Jomarie Longs, PA-C    Family History Family History  Problem Relation Age of Onset   Irritable bowel syndrome Mother    Anxiety disorder Mother    Depression Mother    Cirrhosis Father    Hypertension Maternal Grandfather    Heart attack Maternal Grandfather    Stroke Maternal Grandfather     Social History Social History   Tobacco Use   Smoking status: Never   Smokeless tobacco: Never  Vaping Use   Vaping Use: Never used  Substance Use Topics   Alcohol use: Never   Drug use: Never     Allergies   Amoxicillin-pot clavulanate, Metformin and related, Morphine and related, and Trileptal [oxcarbazepine]   Review of Systems Review of Systems See HPI  Physical Exam Triage Vital  Signs ED Triage Vitals [12/26/20 1956]  Enc Vitals Group     BP 128/83     Pulse Rate 100     Resp 18     Temp 99.5 F (37.5 C)     Temp Source Oral     SpO2 96 %     Weight      Height      Head Circumference      Peak Flow  Pain Score      Pain Loc      Pain Edu?      Excl. in GC?    No data found.  Updated Vital Signs BP 128/83 Comment (BP Location): R FA  Pulse 100   Temp 99.5 F (37.5 C) (Oral)   Resp 18   Ht 5\' 4"  (1.626 m)   Wt (!) 154.2 kg   SpO2 96%   BMI 58.36 kg/m      Physical Exam Constitutional:      General: She is not in acute distress.    Appearance: She is well-developed. She is obese.  HENT:     Head: Normocephalic and atraumatic.     Mouth/Throat:     Comments: Mask is in place Eyes:     Conjunctiva/sclera: Conjunctivae normal.     Pupils: Pupils are equal, round, and reactive to light.  Cardiovascular:     Rate and Rhythm: Normal rate.  Pulmonary:     Effort: Pulmonary effort is normal. No respiratory distress.  Abdominal:     General: There is no distension.     Palpations: Abdomen is soft.  Genitourinary:   Musculoskeletal:        General: Normal range of motion.     Cervical back: Normal range of motion.  Skin:    General: Skin is warm and dry.  Neurological:     Mental Status: She is alert.     UC Treatments / Results  Labs (all labs ordered are listed, but only abnormal results are displayed) Labs Reviewed - No data to display  EKG   Radiology No results found.  Procedures Procedures (including critical care time)  Medications Ordered in UC Medications - No data to display  Initial Impression / Assessment and Plan / UC Course  I have reviewed the triage vital signs and the nursing notes.  Pertinent labs & imaging results that were available during my care of the patient were reviewed by me and considered in my medical decision making (see chart for details).     Recommend off work .  Try to  stay off of her bottom.  Wash daily and apply dressing, may use maxi pad for the abscess and antibiotic ointment on the burns.  May wrap with gauze if needed.  This should heal up. Expresses an allergy to Augmentin.  We will give her Omnicef. She has a follow-up with her PCP next week Final Clinical Impressions(s) / UC Diagnoses   Final diagnoses:  Abscess  Partial thickness burn of thigh, initial encounter     Discharge Instructions      Take antibiotic 2 times a day May use warm compresses but recommend to stay warm towel May put dressings on if preferred, but also may leave open with antibiotic ointment. See your doctor next week     ED Prescriptions     Medication Sig Dispense Auth. Provider   cefdinir (OMNICEF) 300 MG capsule Take 1 capsule (300 mg total) by mouth 2 (two) times daily. 14 capsule June, MD      PDMP not reviewed this encounter.   Eustace Moore, MD 12/26/20 2018

## 2020-12-26 NOTE — Discharge Instructions (Signed)
Take antibiotic 2 times a day May use warm compresses but recommend to stay warm towel May put dressings on if preferred, but also may leave open with antibiotic ointment. See your doctor next week

## 2021-01-01 ENCOUNTER — Other Ambulatory Visit: Payer: Self-pay | Admitting: Physician Assistant

## 2021-01-01 ENCOUNTER — Other Ambulatory Visit: Payer: Self-pay

## 2021-01-01 ENCOUNTER — Encounter: Payer: Self-pay | Admitting: Physician Assistant

## 2021-01-01 ENCOUNTER — Ambulatory Visit (INDEPENDENT_AMBULATORY_CARE_PROVIDER_SITE_OTHER): Payer: BC Managed Care – PPO | Admitting: Physician Assistant

## 2021-01-01 VITALS — BP 126/83 | HR 85 | Ht 64.0 in | Wt 340.0 lb

## 2021-01-01 DIAGNOSIS — F411 Generalized anxiety disorder: Secondary | ICD-10-CM

## 2021-01-01 DIAGNOSIS — F39 Unspecified mood [affective] disorder: Secondary | ICD-10-CM | POA: Diagnosis not present

## 2021-01-01 DIAGNOSIS — F331 Major depressive disorder, recurrent, moderate: Secondary | ICD-10-CM | POA: Diagnosis not present

## 2021-01-01 DIAGNOSIS — M5416 Radiculopathy, lumbar region: Secondary | ICD-10-CM

## 2021-01-01 DIAGNOSIS — E1165 Type 2 diabetes mellitus with hyperglycemia: Secondary | ICD-10-CM | POA: Diagnosis not present

## 2021-01-01 MED ORDER — SITAGLIPTIN-METFORMIN HCL 50-1000 MG PO TABS
1.0000 | ORAL_TABLET | Freq: Two times a day (BID) | ORAL | 1 refills | Status: DC
Start: 2021-01-01 — End: 2021-01-03

## 2021-01-01 MED ORDER — LAMOTRIGINE 25 MG PO TABS: ORAL_TABLET | ORAL | 1 refills | Status: DC

## 2021-01-01 NOTE — Progress Notes (Signed)
Subjective:    Patient ID: Karen Stokes, female    DOB: [birth year], 35 y.o.   MRN: [id number]  HPI  35 y/o female presents to the clinic for a cc of anxiety and mood changes.  Pt states her anxiety has increased over the past month. Pt states she is more anxious when in the car going on drives. She states she is more anxious when she is alone and finds herself being more codependent which is not normally like her. Pt also states she has been finding herself doing more "dumb" things even when she knows better such as sitting on a hot pad for too long and getting second degree burns. She is finding herself wanting more sex than usual. She is not sleeping well. She feels like her brain is always running. She denies increased stress or significant life changes. She states she has been struggling with eating healthy and getting exercise.    .. Active Ambulatory Problems    Diagnosis Date Noted   Eosinophilic esophagitis    Irritable bowel syndrome    Palpitations 02/01/2018   Class 3 severe obesity due to excess calories with serious comorbidity and body mass index (BMI) of 50.0 to 59.9 in adult (HCC) 02/01/2018   Uncontrolled stage 2 hypertension 02/01/2018   Acute right-sided low back pain without sciatica 02/01/2018   Dyspnea on exertion 02/01/2018   GAD (generalized anxiety disorder) 02/01/2018   Severe episode of recurrent major depressive disorder, without psychotic features (HCC) 02/01/2018   Uncontrolled type 2 diabetes mellitus with hyperglycemia (HCC) 02/13/2018   Intractable migraine without aura and without status migrainosus 03/17/2018   Encounter for routine checking of intrauterine contraceptive device (IUD) 03/17/2018   Hyperlipidemia associated with type 2 diabetes mellitus (HCC) 05/09/2018   Hypertension associated with diabetes (HCC) 05/09/2018   Compulsive behaviors 05/09/2018   Diabetic eye exam (HCC) 05/09/2018   History of shingles 12/21/2018   Strain of rhomboid  muscle 03/28/2019   Seasonal allergies 08/01/2019   Allergic conjunctivitis and rhinitis, bilateral 08/01/2019   Scalp itch 09/27/2019   Moderate episode of recurrent major depressive disorder (HCC) 12/27/2019   Stress at home 03/29/2020   Left lumbar radiculitis 10/22/2020   Resolved Ambulatory Problems    Diagnosis Date Noted   No Resolved Ambulatory Problems   Past Medical History:  Diagnosis Date   Allergy    Anxiety    Depression    GERD (gastroesophageal reflux disease)    Hypertension    Obesity      Review of Systems See HPI.     Objective:   Physical Exam Vitals reviewed.  Constitutional:      Appearance: Normal appearance. She is obese.  Cardiovascular:     Rate and Rhythm: Normal rate.     Pulses: Normal pulses.  Psychiatric:        Mood and Affect: Mood normal.        Behavior: Behavior normal.        Thought Content: Thought content normal.        Judgment: Judgment normal.   .. Depression screen [facility] 2/9 01/01/2021 03/27/2020 12/27/2019 09/27/2019 07/04/2019  Decreased Interest 2 0 1 2 1   Down, Depressed, Hopeless 0 0 1 2 2   PHQ - 2 Score 2 0 2 4 3   Altered sleeping 3 2 2 3 2   Tired, decreased energy 3 1 2 3 1   Change in appetite 3 0 2 3 2   Feeling bad or failure about yourself  0  0 2 3 1   Trouble concentrating 0 0 2 3 2   Moving slowly or fidgety/restless 0 0 2 3 0  Suicidal thoughts 0 0 0 0 0  PHQ-9 Score 11 3 14 22 11   Difficult doing work/chores Somewhat difficult Not difficult at all Somewhat difficult Very difficult Somewhat difficult  Some recent data might be hidden   . GAD 7 : Generalized Anxiety Score 01/01/2021 03/27/2020 12/27/2019 09/27/2019  Nervous, Anxious, on Edge 3 1 2 2   Control/stop worrying 2 1 2 2   Worry too much - different things 2 1 1 2   Trouble relaxing 2 1 2 2   Restless 2 1 1 2   Easily annoyed or irritable 2 0 2 2  Afraid - awful might happen 2 0 3 2  Total GAD 7 Score 15 5 13 14   Anxiety Difficulty Somewhat difficult  Not difficult at all Somewhat difficult Very difficult   MDQ 7/13.         Assessment & Plan:  9/17/20216/18/2021Seng was seen today for follow-up.  Diagnoses and all orders for this visit:  Moderate episode of recurrent major depressive disorder (HCC) -     lamoTRIgine (LAMICTAL) 25 MG tablet; Take one tablet daily for one week then increase to two tablet daily.  GAD (generalized anxiety disorder)  Mood disorder (HCC) -     lamoTRIgine (LAMICTAL) 25 MG tablet; Take one tablet daily for one week then increase to two tablet daily.  Uncontrolled type 2 diabetes mellitus with hyperglycemia (HCC) -     sitaGLIPtin-metformin (JANUMET) 50-1000 MG tablet; Take 1 tablet by mouth 2 (two) times daily with a meal.  PHQ, GAD and MDQ are all positive in half.  She does have a family history of bipolar and her daughter has been diagnosed with bipolar.  She is exhibiting some signs and symptoms.  She had previous really good control with fluoxetine and bupropion.  I am going to add Lamictal 25 mg and may increase to 2:50 weeks.  Follow-up in 4 weeks.  She is currently not on anything for her diabetes.  My fear is that she is going to be very out of control.  We do need to find something her insurance will pay for.  We are going to try Janumet daily.  Follow-up in 1 month for A1c.   PA-C, have reviewed and agree with the above documentation in it's entirety.

## 2021-01-02 ENCOUNTER — Telehealth: Payer: Self-pay

## 2021-01-02 NOTE — Telephone Encounter (Signed)
PA renewal request received from Cover[employer]eds for Ozempic 2 mg/1.5 mL. Pt is no longer on this medication. Not filed via CoverMyMeds with this statement;

## 2021-01-03 ENCOUNTER — Encounter: Payer: Self-pay | Admitting: Physician Assistant

## 2021-01-03 DIAGNOSIS — E1165 Type 2 diabetes mellitus with hyperglycemia: Secondary | ICD-10-CM

## 2021-01-03 MED ORDER — SITAGLIPTIN PHOS-METFORMIN HCL 50-1000 MG PO TABS: 1.0000 | ORAL_TABLET | Freq: Two times a day (BID) | ORAL | 1 refills | Status: DC

## 2021-01-10 ENCOUNTER — Encounter: Payer: Self-pay | Admitting: Physician Assistant

## 2021-01-10 ENCOUNTER — Telehealth: Payer: BC Managed Care – PPO | Admitting: Physician Assistant

## 2021-01-10 DIAGNOSIS — B373 Candidiasis of vulva and vagina: Secondary | ICD-10-CM

## 2021-01-10 DIAGNOSIS — B3731 Acute candidiasis of vulva and vagina: Secondary | ICD-10-CM

## 2021-01-10 MED ORDER — FLUCONAZOLE 150 MG PO TABS: 150.0000 mg | ORAL_TABLET | Freq: Once | ORAL | 0 refills | Status: AC

## 2021-01-10 NOTE — Progress Notes (Signed)
E-Visit for Vaginal Symptoms  We are sorry that you are not feeling well. Here is how we plan to help! Based on what you shared with me it looks like you: May have a yeast vaginosis  Vaginosis is an inflammation of the vagina that can result in discharge, itching and pain. The cause is usually a change in the normal balance of vaginal bacteria or an infection. Vaginosis can also result from reduced estrogen levels after menopause.  The most common causes of vaginosis are:   Bacterial vaginosis which results from an overgrowth of one on several organisms that are normally present in your vagina.   Yeast infections which are caused by a naturally occurring fungus called candida.   Vaginal atrophy (atrophic vaginosis) which results from the thinning of the vagina from reduced estrogen levels after menopause.   Trichomoniasis which is caused by a parasite and is commonly transmitted by sexual intercourse.  Factors that increase your risk of developing vaginosis include: Medications, such as antibiotics and steroids Uncontrolled diabetes Use of hygiene products such as bubble bath, vaginal spray or vaginal deodorant Douching Wearing damp or tight-fitting clothing Using an intrauterine device (IUD) for birth control Hormonal changes, such as those associated with pregnancy, birth control pills or menopause Sexual activity Having a sexually transmitted infection  Your treatment plan is A single Diflucan (fluconazole) 150mg  tablet once.  I have electronically sent this prescription into the pharmacy that you have chosen.  Karen Stokes,  Vaginal yeast infection have common symptoms of vaginal itching and discharge. You dont have any vaginal itching, but sine you have been on an antibiotic recently, will assume your symptoms are likely due to yeast infection. If symptoms persist, or any new symptoms, please follow up with your doctor.   Be sure to take all of the medication as directed. Stop  taking any medication if you develop a rash, tongue swelling or shortness of breath. Mothers who are breast feeding should consider pumping and discarding their breast milk while on these antibiotics. However, there is no consensus that infant exposure at these doses would be harmful.  Remember that medication creams can weaken latex condoms. Karen Stokes   HOME CARE:  Good hygiene may prevent some types of vaginosis from recurring and may relieve some symptoms:  Avoid baths, hot tubs and whirlpool spas. Rinse soap from your outer genital area after a shower, and dry the area well to prevent irritation. Don't use scented or harsh soaps, such as those with deodorant or antibacterial action. Avoid irritants. These include scented tampons and pads. Wipe from front to back after using the toilet. Doing so avoids spreading fecal bacteria to your vagina.  Other things that may help prevent vaginosis include:  Don't douche. Your vagina doesn't require cleansing other than normal bathing. Repetitive douching disrupts the normal organisms that reside in the vagina and can actually increase your risk of vaginal infection. Douching won't clear up a vaginal infection. Use a latex condom. Both female and female latex condoms may help you avoid infections spread by sexual contact. Wear cotton underwear. Also wear pantyhose with a cotton crotch. If you feel comfortable without it, skip wearing underwear to bed. Yeast thrives in [place] Your symptoms should improve in the next day or two.  GET HELP RIGHT AWAY IF:  You have pain in your lower abdomen ( pelvic area or over your ovaries) You develop nausea or vomiting You develop a fever Your discharge changes or worsens You have persistent pain with  intercourse You develop shortness of breath, a rapid pulse, or you faint.  These symptoms could be signs of problems or infections that need to be evaluated by a medical provider now.  MAKE SURE YOU    Understand these instructions. Will watch your condition. Will get help right away if you are not doing well or get worse.  Thank you for choosing an e-visit.  Your e-visit answers were reviewed by a board certified advanced clinical practitioner to complete your personal care plan. Depending upon the condition, your plan could have included both over the counter or prescription medications.  Please review your pharmacy choice. Make sure the pharmacy is open so you can pick up prescription now. If there is a problem, you may contact your provider through [employer] and have the prescription routed to another pharmacy.  Your safety is important to [place]. If you have drug allergies check your prescription carefully.   For the next 24 hours you can use MyChart to ask questions about today's visit, request a non-urgent call back, or ask for a work or school excuse. You will get an email in the next two days asking about your experience. I hope that your e-visit has been valuable and will speed your recovery. I spent 5-10 minutes on review and completion of this note- [name] [facility]

## 2021-01-25 ENCOUNTER — Other Ambulatory Visit: Payer: Self-pay | Admitting: Physician Assistant

## 2021-01-25 DIAGNOSIS — F331 Major depressive disorder, recurrent, moderate: Secondary | ICD-10-CM

## 2021-01-25 DIAGNOSIS — F39 Unspecified mood [affective] disorder: Secondary | ICD-10-CM

## 2021-01-29 ENCOUNTER — Ambulatory Visit: Payer: BC Managed Care – PPO | Admitting: Physician Assistant

## 2021-02-11 ENCOUNTER — Other Ambulatory Visit: Payer: Self-pay | Admitting: Physician Assistant

## 2021-02-11 DIAGNOSIS — F331 Major depressive disorder, recurrent, moderate: Secondary | ICD-10-CM

## 2021-02-11 DIAGNOSIS — F39 Unspecified mood [affective] disorder: Secondary | ICD-10-CM

## 2021-03-04 ENCOUNTER — Encounter: Payer: Self-pay | Admitting: Physician Assistant

## 2021-03-04 ENCOUNTER — Ambulatory Visit (INDEPENDENT_AMBULATORY_CARE_PROVIDER_SITE_OTHER): Payer: BC Managed Care – PPO | Admitting: Physician Assistant

## 2021-03-04 ENCOUNTER — Other Ambulatory Visit: Payer: Self-pay

## 2021-03-04 VITALS — BP 127/79 | HR 94 | Temp 98.4°F | Ht 64.0 in | Wt 354.0 lb

## 2021-03-04 DIAGNOSIS — I10 Essential (primary) hypertension: Secondary | ICD-10-CM

## 2021-03-04 DIAGNOSIS — E785 Hyperlipidemia, unspecified: Secondary | ICD-10-CM | POA: Diagnosis not present

## 2021-03-04 DIAGNOSIS — Z1159 Encounter for screening for other viral diseases: Secondary | ICD-10-CM

## 2021-03-04 DIAGNOSIS — F39 Unspecified mood [affective] disorder: Secondary | ICD-10-CM

## 2021-03-04 DIAGNOSIS — F331 Major depressive disorder, recurrent, moderate: Secondary | ICD-10-CM

## 2021-03-04 DIAGNOSIS — E1165 Type 2 diabetes mellitus with hyperglycemia: Secondary | ICD-10-CM | POA: Diagnosis not present

## 2021-03-04 DIAGNOSIS — E1169 Type 2 diabetes mellitus with other specified complication: Secondary | ICD-10-CM | POA: Diagnosis not present

## 2021-03-04 DIAGNOSIS — E1159 Type 2 diabetes mellitus with other circulatory complications: Secondary | ICD-10-CM | POA: Diagnosis not present

## 2021-03-04 DIAGNOSIS — I152 Hypertension secondary to endocrine disorders: Secondary | ICD-10-CM | POA: Diagnosis not present

## 2021-03-04 DIAGNOSIS — Z1329 Encounter for screening for other suspected endocrine disorder: Secondary | ICD-10-CM

## 2021-03-04 LAB — POCT GLYCOSYLATED HEMOGLOBIN (HGB A1C): Hemoglobin A1C: 7.8 % — AB (ref 4.0–5.6)

## 2021-03-04 MED ORDER — TIRZEPATIDE 2.5 MG/0.5ML ~~LOC~~ SOAJ: 2.5000 mg | SUBCUTANEOUS | 0 refills | Status: DC

## 2021-03-04 MED ORDER — TIRZEPATIDE 5 MG/0.5ML ~~LOC~~ SOAJ: 5.0000 mg | SUBCUTANEOUS | 0 refills | Status: DC

## 2021-03-04 MED ORDER — LAMOTRIGINE 25 MG PO TABS: ORAL_TABLET | ORAL | 1 refills | Status: DC

## 2021-03-04 MED ORDER — GLIPIZIDE 10 MG PO TABS: 10.0000 mg | ORAL_TABLET | Freq: Two times a day (BID) | ORAL | 2 refills | Status: DC

## 2021-03-04 MED ORDER — VALSARTAN-HYDROCHLOROTHIAZIDE 320-25 MG PO TABS: 1.0000 | ORAL_TABLET | Freq: Every day | ORAL | 1 refills | Status: DC

## 2021-03-04 MED ORDER — BUPROPION HCL 100 MG PO TABS: 100.0000 mg | ORAL_TABLET | Freq: Two times a day (BID) | ORAL | 1 refills | Status: DC

## 2021-03-04 NOTE — Progress Notes (Signed)
Subjective:    Patient ID: Karen Stokes, female    DOB: 1985/08/18, 35 y.o.   MRN: [id number]  HPI 35 y.o obese female with T2DM, HTN, GAD, and MDD presenting to the clinic for an A1C check and lab work. Pt has been without diabetic medications for about 6 months due to insurance coverage and financial issues. Motivated to get back on diabetic medication at today's visit. States her blood glucose has been running in the high 190s to 200s. Reports stressors with recent move has led her to stop prioritizing diet and exercise. Denies vision changes or numbness/tingling. Reports improvement in mood with Lamotrigine and Wellbutrin. Denies sleep disturbances but reports increased fatigue, cold intolerance, hair thinning, facial hair, and weight gain with decreased appetite.   .. Active Ambulatory Problems    Diagnosis Date Noted   Eosinophilic esophagitis    Irritable bowel syndrome    Palpitations 02/01/2018   Class 3 severe obesity due to excess calories with serious comorbidity and body mass index (BMI) of 50.0 to 59.9 in adult (HCC) 02/01/2018   Uncontrolled stage 2 hypertension 02/01/2018   Acute right-sided low back pain without sciatica 02/01/2018   Dyspnea on exertion 02/01/2018   GAD (generalized anxiety disorder) 02/01/2018   Severe episode of recurrent major depressive disorder, without psychotic features (HCC) 02/01/2018   Uncontrolled type 2 diabetes mellitus with hyperglycemia (HCC) 02/13/2018   Intractable migraine without aura and without status migrainosus 03/17/2018   Encounter for routine checking of intrauterine contraceptive device (IUD) 03/17/2018   Hyperlipidemia associated with type 2 diabetes mellitus (HCC) 05/09/2018   Hypertension associated with diabetes (HCC) 05/09/2018   Compulsive behaviors 05/09/2018   Diabetic eye exam (HCC) 05/09/2018   History of shingles 12/21/2018   Strain of rhomboid muscle 03/28/2019   Seasonal allergies 08/01/2019   Allergic  conjunctivitis and rhinitis, bilateral 08/01/2019   Scalp itch 09/27/2019   Moderate episode of recurrent major depressive disorder (HCC) 12/27/2019   Stress at home 03/29/2020   Left lumbar radiculitis 10/22/2020   Resolved Ambulatory Problems    Diagnosis Date Noted   No Resolved Ambulatory Problems   Past Medical History:  Diagnosis Date   Allergy    Anxiety    Depression    GERD (gastroesophageal reflux disease)    Hypertension    Obesity      Review of Systems See HPI.     Objective:   Physical Exam Vitals reviewed.  Constitutional:      Appearance: Normal appearance. She is obese.  HENT:     Head: Normocephalic.  Neck:     Vascular: No carotid bruit.  Cardiovascular:     Rate and Rhythm: Normal rate and regular rhythm.     Pulses: Normal pulses.     Heart sounds: Normal heart sounds.  Pulmonary:     Effort: Pulmonary effort is normal.     Breath sounds: Normal breath sounds.  Lymphadenopathy:     Cervical: No cervical adenopathy.  Neurological:     General: No focal deficit present.     Mental Status: She is alert.  Psychiatric:        Mood and Affect: Mood normal.  .. Results for orders placed or performed in visit on 03/04/21  POCT glycosylated hemoglobin (Hb A1C)  Result Value Ref Range   Hemoglobin A1C 7.8 (A) 4.0 - 5.6 %   HbA1c POC (<> result, manual entry)     HbA1c, POC (prediabetic range)     HbA1c, POC (controlled  diabetic range)             Assessment & Plan:  Karen Stokes was seen today for diabetes and medication refills.   Diagnoses and all orders for this visit:  Uncontrolled type 2 diabetes mellitus with hyperglycemia (HCC) -     POCT glycosylated hemoglobin (Hb A1C) -     CBC with Differential/Platelet -     COMPLETE METABOLIC PANEL WITH GFR -    Begin tirzepatide (MOUNJARO) 2.5 MG/0.5ML Pen; Inject 2.5 mg into the skin once a week. Titrate up to tirzepatide [facility]) 5 MG/0.5ML Pen; Inject 5 mg into the skin once a  week. -Provided pt with Mounjaro coupon due to insurance coverage issues. If still too expensive recommended pt begin glipiZIDE (GLUCOTROL) 10 MG tablet; Take 1 tablet (10 mg total) by mouth 2 (two) times daily before a meal.  Hyperlipidemia associated with type 2 diabetes mellitus (HCC) -     CBC with Differential/Platelet -     COMPLETE METABOLIC PANEL WITH GFR -     Lipid panel  Need for hepatitis C screening test -     Hepatitis C antibody  Primary hypertension -     valsartan-hydrochlorothiazide (DIOVAN-HCT) 320-25 MG tablet; Take 1 tablet by mouth daily.  Moderate episode of recurrent major depressive disorder (HCC) -     lamoTRIgine (LAMICTAL) 25 MG tablet; Take two tablets daily. -     buPROPion (WELLBUTRIN) 100 MG tablet; Take 1 tablet (100 mg total) by mouth 2 (two) times daily.  Mood disorder (HCC) -     lamoTRIgine (LAMICTAL) 25 MG tablet; Take two tablets daily.  Thyroid disorder screen -     TSH  A1C at today's visit increased to 7.8% after being without medication due to coverage issues. Begin MOUNJARO with coupon code or glipiZIDE if too expensive.  On statin.  BP to goal. On ARB. Eye and foot exam UTD.  Covid vaccine x2.  Flu vaccine UTD.  Follow up in 3 months.   Stabilized mood with Lamotrigene and Wellbutrin. Refills sent in.   TSH ordered due to complaints of fatigue, hair thinning, and cold intolerance.   Follow-up in 3 months.

## 2021-03-05 ENCOUNTER — Encounter: Payer: Self-pay | Admitting: Physician Assistant

## 2021-03-05 LAB — COMPLETE METABOLIC PANEL WITH GFR
AG Ratio: 1.6 (calc) (ref 1.0–2.5)
ALT: 23 U/L (ref 6–29)
AST: 12 U/L (ref 10–30)
Albumin: 4 g/dL (ref 3.6–5.1)
Alkaline phosphatase (APISO): 102 U/L (ref 31–125)
BUN: 13 mg/dL (ref 7–25)
CO2: 28 mmol/L (ref 20–32)
Calcium: 9.1 mg/dL (ref 8.6–10.2)
Chloride: 101 mmol/L (ref 98–110)
Creat: 0.73 mg/dL (ref 0.50–0.97)
Globulin: 2.5 g/dL (calc) (ref 1.9–3.7)
Glucose, Bld: 205 mg/dL — ABNORMAL HIGH (ref 65–99)
Potassium: 4 mmol/L (ref 3.5–5.3)
Sodium: 140 mmol/L (ref 135–146)
Total Bilirubin: 0.3 mg/dL (ref 0.2–1.2)
Total Protein: 6.5 g/dL (ref 6.1–8.1)
eGFR: 111 mL/min/{1.73_m2} (ref >=60)

## 2021-03-05 LAB — LIPID PANEL
Cholesterol: 171 mg/dL (ref <200)
HDL: 46 mg/dL — ABNORMAL LOW (ref >=50)
LDL Cholesterol (Calc): 100 mg/dL (calc) — ABNORMAL HIGH
Non-HDL Cholesterol (Calc): 125 mg/dL (calc) (ref <130)
Total CHOL/HDL Ratio: 3.7 (calc) (ref <5.0)
Triglycerides: 149 mg/dL (ref <150)

## 2021-03-05 LAB — CBC WITH DIFFERENTIAL/PLATELET
Absolute Monocytes: 459 cells/uL (ref 200–950)
Basophils Absolute: 36 cells/uL (ref 0–200)
Basophils Relative: 0.4 %
Eosinophils Absolute: 207 cells/uL (ref 15–500)
Eosinophils Relative: 2.3 %
HCT: 38.3 % (ref 35.0–45.0)
Hemoglobin: 12.6 g/dL (ref 11.7–15.5)
Lymphs Abs: 1944 cells/uL (ref 850–3900)
MCH: 28.9 pg (ref 27.0–33.0)
MCHC: 32.9 g/dL (ref 32.0–36.0)
MCV: 87.8 fL (ref 80.0–100.0)
MPV: 11.3 fL (ref 7.5–12.5)
Monocytes Relative: 5.1 %
Neutro Abs: 6354 cells/uL (ref 1500–7800)
Neutrophils Relative %: 70.6 %
Platelets: 240 10*3/uL (ref 140–400)
RBC: 4.36 10*6/uL (ref 3.80–5.10)
RDW: 12 % (ref 11.0–15.0)
Total Lymphocyte: 21.6 %
WBC: 9 10*3/uL (ref 3.8–10.8)

## 2021-03-05 LAB — HEPATITIS C ANTIBODY
Hepatitis C Ab: NONREACTIVE
SIGNAL TO CUT-OFF: 0.05 (ref <1.00)

## 2021-03-05 LAB — TSH: TSH: 2.57 mIU/L

## 2021-03-05 MED ORDER — ATORVASTATIN CALCIUM 80 MG PO TABS: 80.0000 mg | ORAL_TABLET | Freq: Every day | ORAL | 3 refills | Status: DC

## 2021-03-05 NOTE — Progress Notes (Signed)
Labs overall look great.  LDL goal is under 70. You are not there. Are you taking the lipitor 40mg  daily?

## 2021-03-07 ENCOUNTER — Encounter: Payer: Self-pay | Admitting: Physician Assistant

## 2021-03-14 NOTE — Telephone Encounter (Signed)
This encounter was created in error - please disregard.

## 2021-03-14 NOTE — Telephone Encounter (Signed)
Mychart message sent to pt stating:  Hi Karen Stokes,   The forms have been completed as far as Karen Stokes's portion goes. There is a section where you need to sign the form and another section where your weight circumference needs to be documented. You can stop by our office and pick up the forms but you will need to be sure and complete those two sections before turning it in to [employer]. If you have any questions or concerns give [place] a call at [phone].

## 2021-04-18 IMAGING — DX DG LUMBAR SPINE COMPLETE 4+V
5 series · 5 of 5 positions shown · non-contrast
Comparison: None.

CLINICAL DATA: Burning sensation in the low back

EXAM:
LUMBAR SPINE - COMPLETE 4+ VIEW

[l-spine ap]
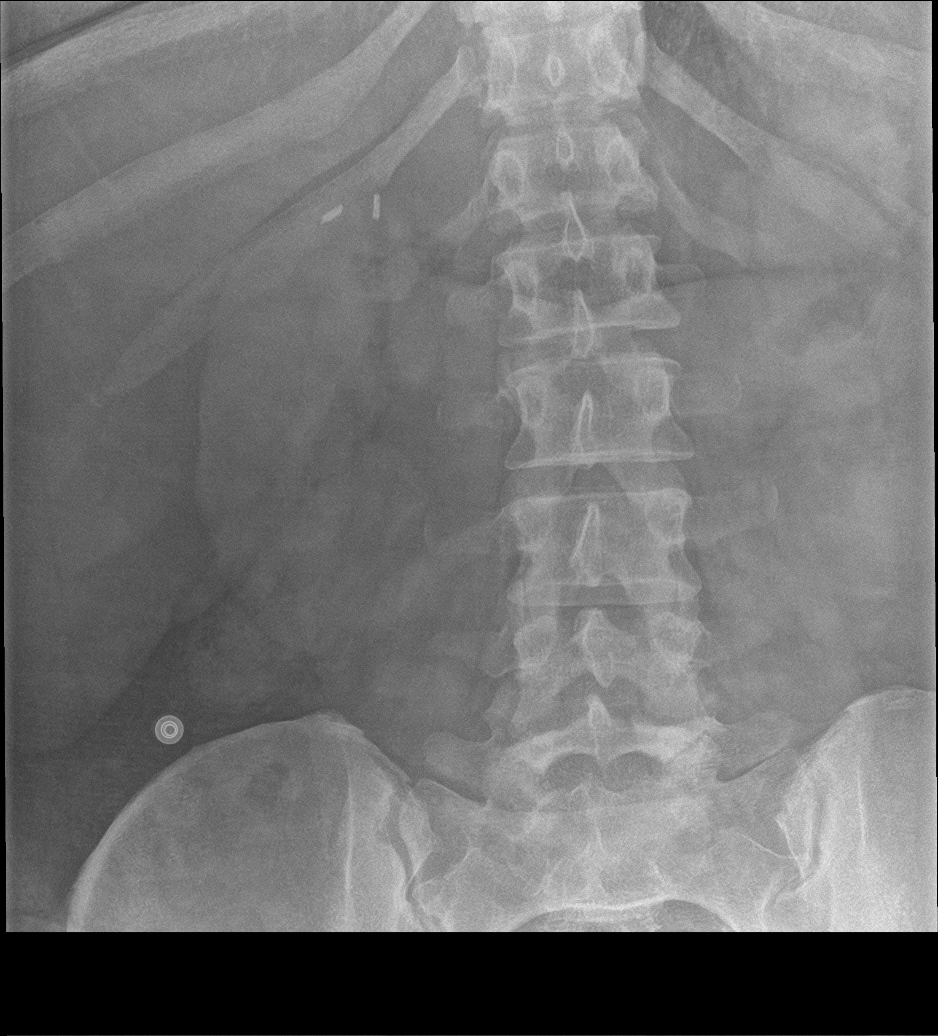

[l-spine obl (1 of 2)]
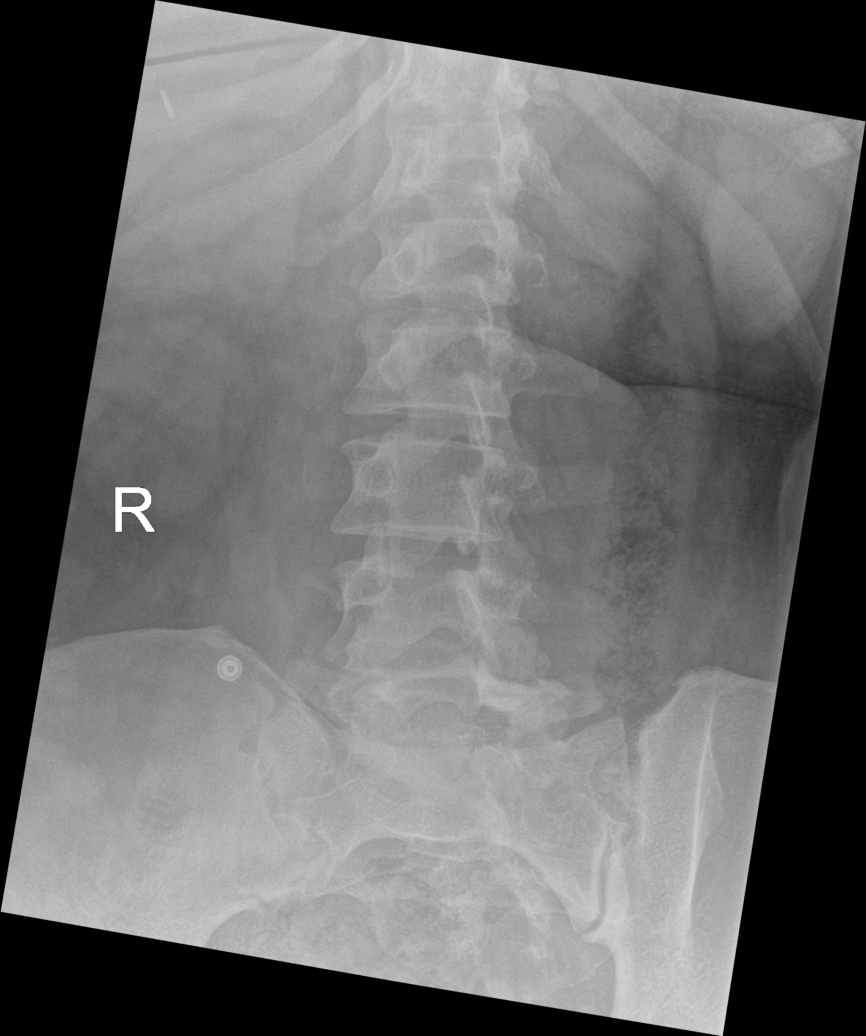

[l-spine obl (2 of 2)]
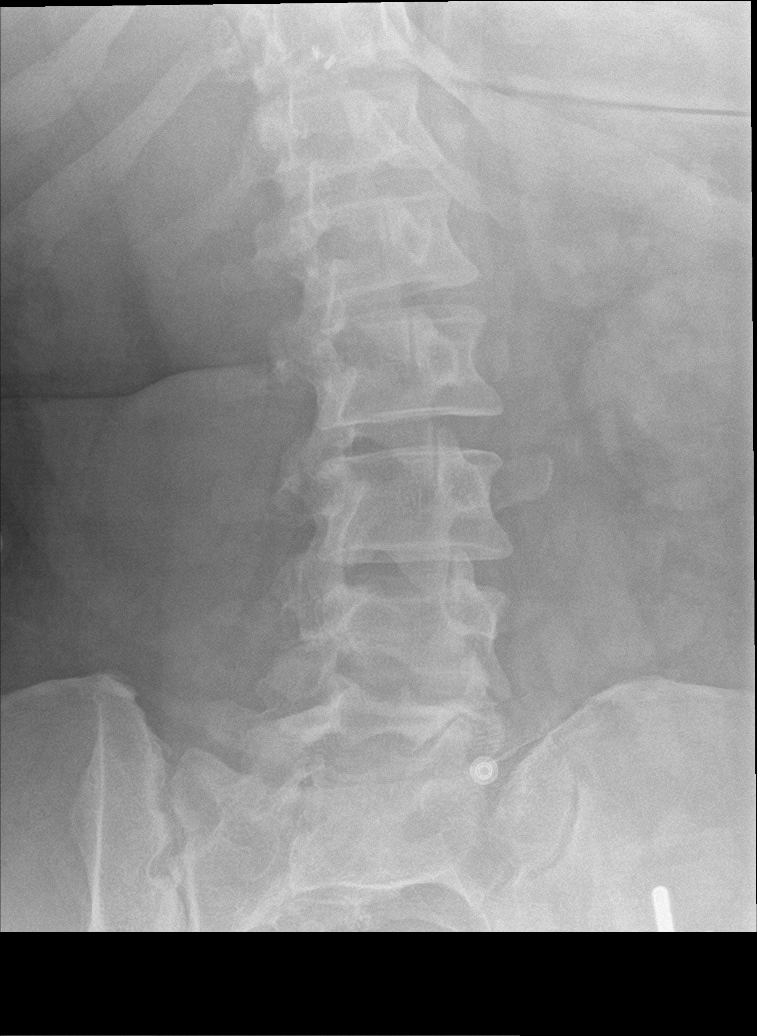

[l-spine lat]
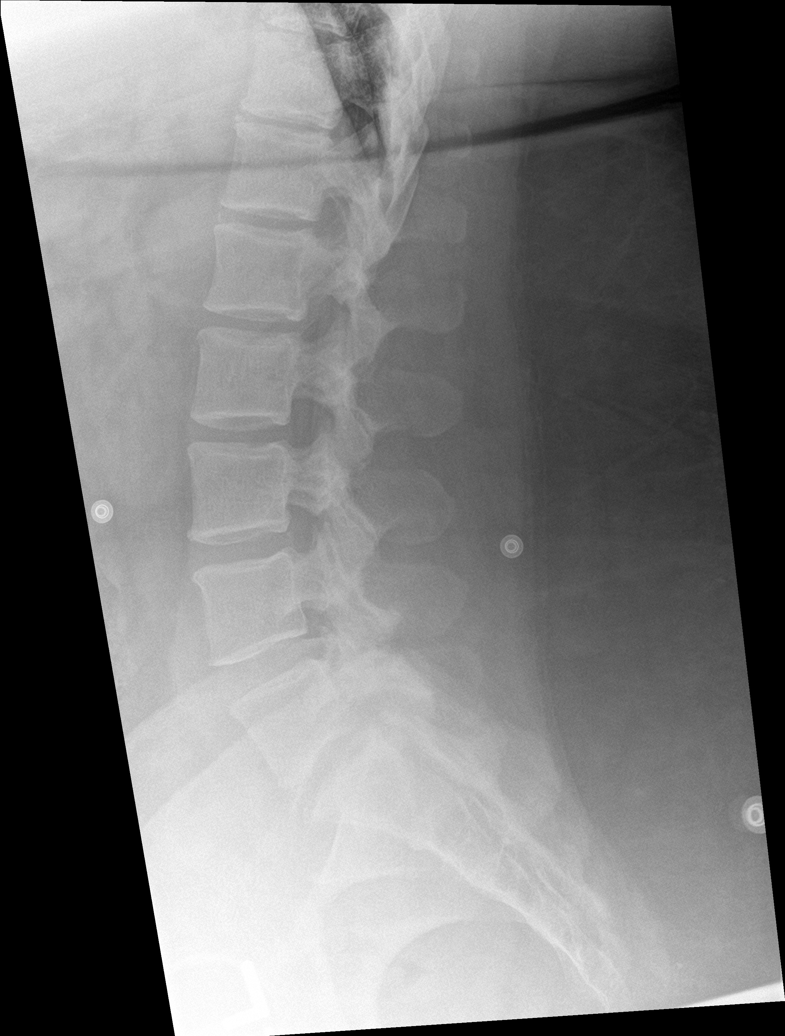

[l-spine spot]
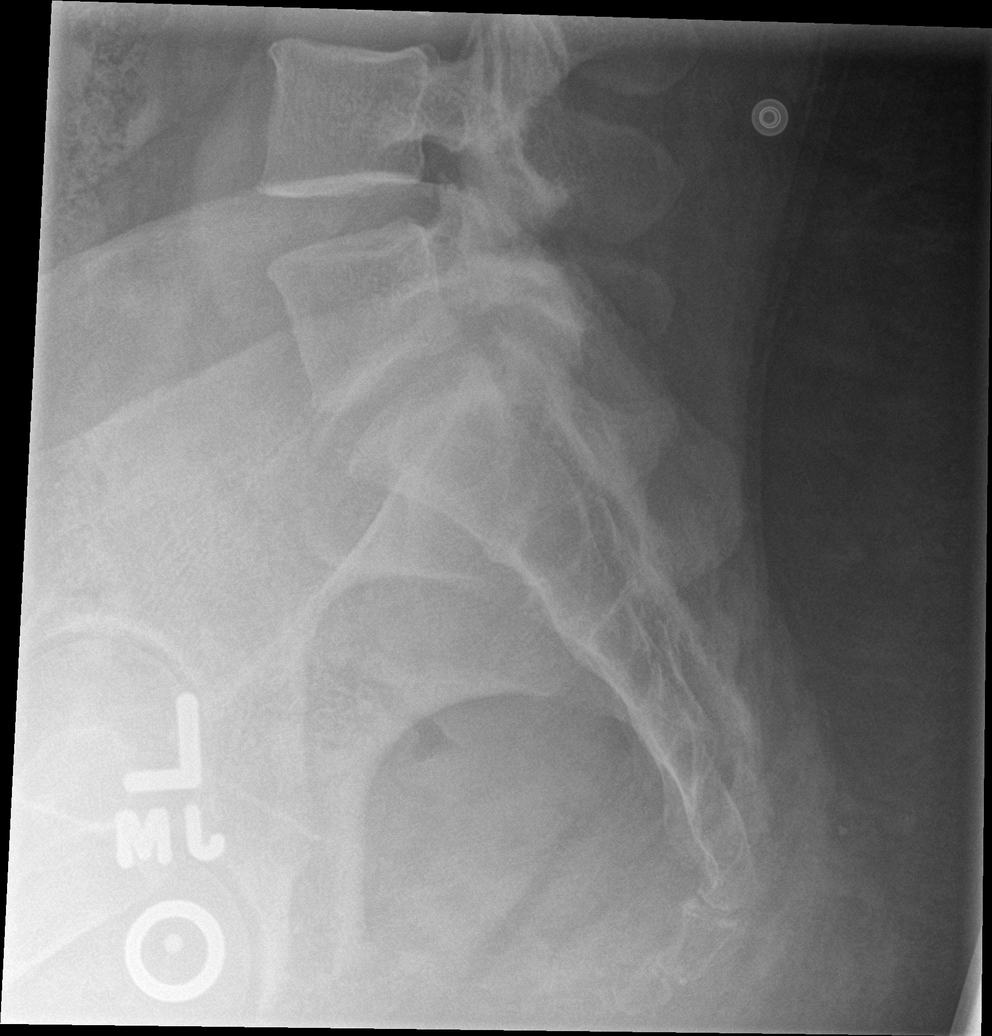

[5 of 5 positions shown; findings below may reference images not displayed]

FINDINGS: There is no evidence of lumbar spine fracture. Alignment is normal.
Intervertebral disc spaces are maintained.
IMPRESSION: Negative.

## 2021-04-22 ENCOUNTER — Other Ambulatory Visit: Payer: Self-pay | Admitting: Physician Assistant

## 2021-04-22 DIAGNOSIS — M5416 Radiculopathy, lumbar region: Secondary | ICD-10-CM

## 2021-05-01 ENCOUNTER — Encounter: Payer: Self-pay | Admitting: Physician Assistant

## 2021-05-02 MED ORDER — MOUNJARO 7.5 MG/0.5ML ~~LOC~~ SOAJ: 7.5000 mg | SUBCUTANEOUS | 0 refills | Status: DC

## 2021-06-04 ENCOUNTER — Ambulatory Visit: Payer: BC Managed Care – PPO | Admitting: Physician Assistant

## 2021-06-04 DIAGNOSIS — E1165 Type 2 diabetes mellitus with hyperglycemia: Secondary | ICD-10-CM

## 2021-06-07 ENCOUNTER — Other Ambulatory Visit: Payer: Self-pay | Admitting: Physician Assistant

## 2021-06-07 DIAGNOSIS — M5416 Radiculopathy, lumbar region: Secondary | ICD-10-CM

## 2021-06-11 ENCOUNTER — Other Ambulatory Visit: Payer: Self-pay | Admitting: Physician Assistant

## 2021-06-11 DIAGNOSIS — F439 Reaction to severe stress, unspecified: Secondary | ICD-10-CM

## 2021-06-11 DIAGNOSIS — F411 Generalized anxiety disorder: Secondary | ICD-10-CM

## 2021-06-11 NOTE — Telephone Encounter (Signed)
Last written 12/04/2020 #60 with 2 refills ?Last appt 03/04/2021 ?

## 2021-06-17 ENCOUNTER — Other Ambulatory Visit: Payer: Self-pay | Admitting: Physician Assistant

## 2021-06-17 DIAGNOSIS — F332 Major depressive disorder, recurrent severe without psychotic features: Secondary | ICD-10-CM

## 2021-06-17 DIAGNOSIS — F411 Generalized anxiety disorder: Secondary | ICD-10-CM

## 2021-07-11 ENCOUNTER — Other Ambulatory Visit: Payer: Self-pay | Admitting: Physician Assistant

## 2021-07-11 DIAGNOSIS — M5416 Radiculopathy, lumbar region: Secondary | ICD-10-CM

## 2021-07-14 ENCOUNTER — Telehealth: Payer: BC Managed Care – PPO | Admitting: Physician Assistant

## 2021-07-14 DIAGNOSIS — J111 Influenza due to unidentified influenza virus with other respiratory manifestations: Secondary | ICD-10-CM

## 2021-07-14 MED ORDER — OSELTAMIVIR PHOSPHATE 75 MG PO CAPS: 75.0000 mg | ORAL_CAPSULE | Freq: Two times a day (BID) | ORAL | 0 refills | Status: DC

## 2021-07-14 MED ORDER — ONDANSETRON 4 MG PO TBDP: 4.0000 mg | ORAL_TABLET | Freq: Three times a day (TID) | ORAL | 0 refills | Status: DC | PRN

## 2021-07-14 NOTE — Patient Instructions (Signed)
?Karen Stokes, thank you for joining [name], PA-C for today's virtual visit.  While this provider is not your primary care provider (PCP), if your PCP is located in our provider database this encounter information will be shared with them immediately following your visit. ? ?Consent: ?(Patient) Karen Stokes provided verbal consent for this virtual visit at the beginning of the encounter. ? ?Current Medications: ? ?Current Outpatient Medications:  ?  ondansetron (ZOFRAN-ODT) 4 MG disintegrating tablet, Take 1 tablet (4 mg total) by mouth every 8 (eight) hours as needed for nausea or vomiting., Disp: 20 tablet, Rfl: 0 ?  oseltamivir (TAMIFLU) 75 MG capsule, Take 1 capsule (75 mg total) by mouth 2 (two) times daily., Disp: 10 capsule, Rfl: 0 ?  atorvastatin (LIPITOR) 80 MG tablet, Take 1 tablet (80 mg total) by mouth daily., Disp: 90 tablet, Rfl: 3 ?  azelastine (OPTIVAR) 0.05 % ophthalmic solution, Place 1 drop into both eyes 2 (two) times daily., Disp: 6 mL, Rfl: 11 ?  buPROPion (WELLBUTRIN) 100 MG tablet, Take 1 tablet (100 mg total) by mouth 2 (two) times daily., Disp: 180 tablet, Rfl: 1 ?  clonazePAM (KLONOPIN) 0.5 MG tablet, TAKE 1 TABLET BY MOUTH TWICE A DAY AS NEEDED FOR ANXIETY, Disp: 60 tablet, Rfl: 2 ?  diphenhydrAMINE (BENADRYL) 25 MG tablet, Take 25 mg by mouth at bedtime., Disp: , Rfl:  ?  FLUoxetine (PROZAC) 40 MG capsule, TAKE 1 CAPSULE BY MOUTH  DAILY, Disp: 90 capsule, Rfl: 0 ?  gabapentin (NEURONTIN) 300 MG capsule, Take 1 capsule (300 mg total) by mouth 3 (three) times daily. NEEDS APPT, Disp: 45 capsule, Rfl: 0 ?  ibuprofen (ADVIL) 800 MG tablet, Take 1 tablet (800 mg total) by mouth every 8 (eight) hours as needed., Disp: 90 tablet, Rfl: 0 ?  lamoTRIgine (LAMICTAL) 25 MG tablet, Take two tablets daily., Disp: 180 tablet, Rfl: 1 ?  loratadine (CLARITIN) 10 MG tablet, Take 20 mg by mouth daily., Disp: , Rfl:  ?  montelukast (SINGULAIR) 10 MG tablet, TAKE 1 TABLET BY MOUTH EVERYDAY  AT BEDTIME, Disp: 90 tablet, Rfl: 3 ?  saline (AYR) GEL, Place 1 application into both nostrils., Disp: , Rfl:  ?  tirzepatide (MOUNJARO) 7.5 MG/0.5ML Pen, Inject 7.5 mg into the skin once a week., Disp: 6 mL, Rfl: 0 ?  valsartan-hydrochlorothiazide (DIOVAN-HCT) 320-25 MG tablet, Take 1 tablet by mouth daily., Disp: 90 tablet, Rfl: 1  ? ?Medications ordered in this encounter:  ?Meds ordered this encounter  ?Medications  ? oseltamivir (TAMIFLU) 75 MG capsule  ?  Sig: Take 1 capsule (75 mg total) by mouth 2 (two) times daily.  ?  Dispense:  10 capsule  ?  Refill:  0  ?  Order Specific Question:   Supervising Provider  ?  Answer:   Eber Hong [[id number]]  ? ondansetron (ZOFRAN-ODT) 4 MG disintegrating tablet  ?  Sig: Take 1 tablet (4 mg total) by mouth every 8 (eight) hours as needed for nausea or vomiting.  ?  Dispense:  20 tablet  ?  Refill:  0  ?  Order Specific Question:   Supervising Provider  ?  Answer:   Eber Hong [[id number]]  ?  ? ?*If you need refills on other medications prior to your next appointment, please contact your pharmacy* ? ?Follow-Up: ?Call back or seek an in-person evaluation if the symptoms worsen or if the condition fails to improve as anticipated. ? ?Other Instructions ? ?Influenza, Adult ?Influenza, also called "the flu,"  is a viral infection that mainly affects the respiratory tract. This includes the lungs, nose, and throat. The flu spreads easily from person to person (is contagious). It causes common cold symptoms, along with high fever and body aches. ?What are the causes? ?This condition is caused by the influenza virus. You can get the virus by: ?Breathing in droplets that are in the air from an infected person's cough or sneeze. ?Touching something that has the virus on it (has been contaminated) and then touching your mouth, nose, or eyes. ?What increases the risk? ?The following factors may make you more likely to get the flu: ?Not washing or sanitizing your hands often. ?Having close  contact with many people during cold and flu season. ?Touching your mouth, eyes, or nose without first washing or sanitizing your hands. ?Not getting an annual flu shot. ?You may have a higher risk for the flu, including serious problems, such as a lung infection (pneumonia), if you: ?Are older than 65. ?Are pregnant. ?Have a weakened disease-fighting system (immune system). This includes people who have HIV or AIDS, are on chemotherapy, or are taking medicines that reduce (suppress) the immune system. ?Have a long-term (chronic) illness, such as heart disease, kidney disease, diabetes, or lung disease. ?Have a liver disorder. ?Are severely overweight (morbidly obese). ?Have anemia. ?Have asthma. ?What are the signs or symptoms? ?Symptoms of this condition usually begin suddenly and last 4-14 days. These may include: ?Fever and chills. ?Headaches, body aches, or muscle aches. ?Sore throat. ?Cough. ?Runny or stuffy (congested) nose. ?Chest discomfort. ?Poor appetite. ?Weakness or fatigue. ?Dizziness. ?Nausea or vomiting. ?How is this diagnosed? ?This condition may be diagnosed based on: ?Your symptoms and medical history. ?A physical exam. ?Swabbing your nose or throat and testing the fluid for the influenza virus. ?How is this treated? ?If the flu is diagnosed early, you can be treated with antiviral medicine that is given by mouth (orally) or through an IV. This can help reduce how severe the illness is and how long it lasts. ?Taking care of yourself at home can help relieve symptoms. Your health care provider may recommend: ?Taking over-the-counter medicines. ?Drinking plenty of fluids. ?In many cases, the flu goes away on its own. If you have severe symptoms or complications, you may be treated in a hospital. ?Follow these instructions at home: ?Activity ?Rest as needed and get plenty of sleep. ?Stay home from work or school as told by your health care provider. Unless you are visiting your health care provider,  avoid leaving home until your fever has been gone for 24 hours without taking medicine. ?Eating and drinking ?Take an oral rehydration solution (ORS). This is a drink that is sold at pharmacies and retail stores. ?Drink enough fluid to keep your urine pale yellow. ?Drink clear fluids in small amounts as you are able. Clear fluids include water, ice chips, fruit juice mixed with water, and low-calorie sports drinks. ?Eat bland, easy-to-digest foods in small amounts as you are able. These foods include bananas, applesauce, rice, lean meats, toast, and crackers. ?Avoid drinking fluids that contain a lot of sugar or caffeine, such as energy drinks, regular sports drinks, and soda. ?Avoid alcohol. ?Avoid spicy or fatty foods. ?General instructions ?  ?Take over-the-counter and prescription medicines only as told by your health care provider. ?Use a cool mist humidifier to add humidity to the air in your home. This can make it easier to breathe. ?When using a cool mist humidifier, clean it daily. Empty the water  and replace it with clean water. ?Cover your mouth and nose when you cough or sneeze. ?Wash your hands with soap and water often and for at least 20 seconds, especially after you cough or sneeze. If soap and water are not available, use alcohol-based hand sanitizer. ?Keep all follow-up visits. This is important. ?How is this prevented? ? ?Get an annual flu shot. This is usually available in late summer, fall, or winter. Ask your health care provider when you should get your flu shot. ?Avoid contact with people who are sick during cold and flu season. This is generally fall and winter. ?Contact a health care provider if: ?You develop new symptoms. ?You have: ?Chest pain. ?Diarrhea. ?A fever. ?Your cough gets worse. ?You produce more mucus. ?You feel nauseous or you vomit. ?Get help right away if you: ?Develop shortness of breath or have difficulty breathing. ?Have skin or nails that turn a bluish color. ?Have  severe pain or stiffness in your neck. ?Develop a sudden headache or sudden pain in your face or ear. ?Cannot eat or drink without vomiting. ?These symptoms may represent a serious problem that is an emerge

## 2021-07-14 NOTE — Progress Notes (Signed)
?Virtual Visit Consent  ? ?Karen Stokes, you are scheduled for a virtual visit with a [facility] provider today.   ?  ?Just as with appointments in the office, your consent must be obtained to participate.  Your consent will be active for this visit and any virtual visit you may have with one of our providers in the next 365 days.   ?  ?If you have a MyChart account, a copy of this consent can be sent to you electronically.  All virtual visits are billed to your insurance company just like a traditional visit in the office.   ? ?As this is a virtual visit, video technology does not allow for your provider to perform a traditional examination.  This may limit your provider's ability to fully assess your condition.  If your provider identifies any concerns that need to be evaluated in person or the need to arrange testing (such as labs, EKG, etc.), we will make arrangements to do so.   ?  ?Although advances in technology are sophisticated, we cannot ensure that it will always work on either your end or our end.  If the connection with a video visit is poor, the visit may have to be switched to a telephone visit.  With either a video or telephone visit, we are not always able to ensure that we have a secure connection.    ? ?I need to obtain your verbal consent now.   Are you willing to proceed with your visit today?  ?  ?[name] has provided verbal consent on 07/14/2021 for a virtual visit (video or telephone). ?  ?[name], PA-C  ? ?Date: 07/14/2021 9:52 AM ? ? ?Virtual Visit via Video Note  ? ?Karen Stokes, connected with  Karen Stokes  ([id number], Jul 13, 1985) on 07/14/21 at  9:45 AM EDT by a video-enabled telemedicine application and verified that I am speaking with the correct person using two identifiers. ? ?Location: ?Patient: Virtual Visit Location Patient: Home ?Provider: Virtual Visit Location Provider: Home Office ?  ?I discussed the limitations of evaluation and management by  telemedicine and the availability of in person appointments. The patient expressed understanding and agreed to proceed.   ? ?History of Present Illness: ?Karen Stokes is a 36 y.o. who identifies as a female who was assigned female at birth, and is being seen today for flu-like symptoms. ? ?HPI: URI  ?This is a new problem. The current episode started in the past 7 days (Friday). The problem has been gradually worsening. The maximum temperature recorded prior to her arrival was 103 - 104 F (103.2). Associated symptoms include congestion, coughing, diarrhea, ear pain (left), headaches, nausea, a plugged ear sensation, rhinorrhea, sinus pain, sneezing, a sore throat and vomiting. Associated symptoms comments: Myalgias, fatigue, Friday had bloody drainage from sinuses. She has tried acetaminophen, NSAIDs, sleep, increased fluids and antihistamine (humidifier) for the symptoms. The treatment provided no relief.   ? ? ?Problems:  ?Patient Active Problem List  ? Diagnosis Date Noted  ? Left lumbar radiculitis 10/22/2020  ? Stress at home 03/29/2020  ? Moderate episode of recurrent major depressive disorder (HCC) 12/27/2019  ? Scalp itch 09/27/2019  ? Seasonal allergies 08/01/2019  ? Allergic conjunctivitis and rhinitis, bilateral 08/01/2019  ? Strain of rhomboid muscle 03/28/2019  ? History of shingles 12/21/2018  ? Hyperlipidemia associated with type 2 diabetes mellitus (HCC) 05/09/2018  ? Hypertension associated with diabetes (HCC) 05/09/2018  ? Compulsive behaviors 05/09/2018  ? Diabetic eye  exam (HCC) 05/09/2018  ? Intractable migraine without aura and without status migrainosus 03/17/2018  ? Encounter for routine checking of intrauterine contraceptive device (IUD) 03/17/2018  ? Uncontrolled type 2 diabetes mellitus with hyperglycemia (HCC) 02/13/2018  ? Palpitations 02/01/2018  ? Class 3 severe obesity due to excess calories with serious comorbidity and body mass index (BMI) of 50.0 to 59.9 in adult [facility](HCC) 02/01/2018   ? Uncontrolled stage 2 hypertension 02/01/2018  ? Acute right-sided low back pain without sciatica 02/01/2018  ? Dyspnea on exertion 02/01/2018  ? GAD (generalized anxiety disorder) 02/01/2018  ? Severe episode of recurrent major depressive disorder, without psychotic features (HCC) 02/01/2018  ? Eosinophilic esophagitis   ? Irritable bowel syndrome   ?  ?Allergies:  ?Allergies  ?Allergen Reactions  ? Amoxicillin-Pot Clavulanate Nausea And Vomiting  ? Metformin And Related Diarrhea  ?  Severe GI upset  ? Morphine And Related Other (See Comments)  ?  headaches ?  ? Trileptal [Oxcarbazepine] Other (See Comments)  ?  hallucinations  ? ?Medications:  ?Current Outpatient Medications:  ?  ondansetron (ZOFRAN-ODT) 4 MG disintegrating tablet, Take 1 tablet (4 mg total) by mouth every 8 (eight) hours as needed for nausea or vomiting., Disp: 20 tablet, Rfl: 0 ?  oseltamivir (TAMIFLU) 75 MG capsule, Take 1 capsule (75 mg total) by mouth 2 (two) times daily., Disp: 10 capsule, Rfl: 0 ?  atorvastatin (LIPITOR) 80 MG tablet, Take 1 tablet (80 mg total) by mouth daily., Disp: 90 tablet, Rfl: 3 ?  azelastine (OPTIVAR) 0.05 % ophthalmic solution, Place 1 drop into both eyes 2 (two) times daily., Disp: 6 mL, Rfl: 11 ?  buPROPion (WELLBUTRIN) 100 MG tablet, Take 1 tablet (100 mg total) by mouth 2 (two) times daily., Disp: 180 tablet, Rfl: 1 ?  clonazePAM (KLONOPIN) 0.5 MG tablet, TAKE 1 TABLET BY MOUTH TWICE A DAY AS NEEDED FOR ANXIETY, Disp: 60 tablet, Rfl: 2 ?  diphenhydrAMINE (BENADRYL) 25 MG tablet, Take 25 mg by mouth at bedtime., Disp: , Rfl:  ?  FLUoxetine (PROZAC) 40 MG capsule, TAKE 1 CAPSULE BY MOUTH  DAILY, Disp: 90 capsule, Rfl: 0 ?  gabapentin (NEURONTIN) 300 MG capsule, Take 1 capsule (300 mg total) by mouth 3 (three) times daily. NEEDS APPT, Disp: 45 capsule, Rfl: 0 ?  ibuprofen (ADVIL) 800 MG tablet, Take 1 tablet (800 mg total) by mouth every 8 (eight) hours as needed., Disp: 90 tablet, Rfl: 0 ?  lamoTRIgine  (LAMICTAL) 25 MG tablet, Take two tablets daily., Disp: 180 tablet, Rfl: 1 ?  loratadine (CLARITIN) 10 MG tablet, Take 20 mg by mouth daily., Disp: , Rfl:  ?  montelukast (SINGULAIR) 10 MG tablet, TAKE 1 TABLET BY MOUTH EVERYDAY AT BEDTIME, Disp: 90 tablet, Rfl: 3 ?  saline (AYR) GEL, Place 1 application into both nostrils., Disp: , Rfl:  ?  tirzepatide (MOUNJARO) 7.5 MG/0.5ML Pen, Inject 7.5 mg into the skin once a week., Disp: 6 mL, Rfl: 0 ?  valsartan-hydrochlorothiazide (DIOVAN-HCT) 320-25 MG tablet, Take 1 tablet by mouth daily., Disp: 90 tablet, Rfl: 1 ? ?Observations/Objective: ?Patient is well-developed, well-nourished in no acute distress.  ?Resting comfortably at home.  ?Head is normocephalic, atraumatic.  ?No labored breathing.  ?Speech is clear and coherent with logical content.  ?Patient is alert and oriented at baseline.  ? ? ?Assessment and Plan: ?1. Influenza ?- oseltamivir (TAMIFLU) 75 MG capsule; Take 1 capsule (75 mg total) by mouth 2 (two) times daily.  Dispense: 10 capsule; Refill: 0 ?-  ondansetron (ZOFRAN-ODT) 4 MG disintegrating tablet; Take 1 tablet (4 mg total) by mouth every 8 (eight) hours as needed for nausea or vomiting.  Dispense: 20 tablet; Refill: 0 ? ?- Suspect influenza ?- Tamiflu prescribed ?- Zofran for nausea ?- If symptoms not improving over next 24 hours with medication she should re-test for Covid 19; if positive, she is to reach back out so we may change her treatment ?- If fever persists over the next 72 hours she needs to be evaluated in person ?- Push fluids; electrolyte beverages ?- Rest ?- Seek in person evaluation if not improving or if symptoms worsen ? ?Follow Up Instructions: ?I discussed the assessment and treatment plan with the patient. The patient was provided an opportunity to ask questions and all were answered. The patient agreed with the plan and demonstrated an understanding of the instructions.  A copy of instructions were sent to the patient via MyChart  unless otherwise noted below.  ? ? ?The patient was advised to call back or seek an in-person evaluation if the symptoms worsen or if the condition fails to improve as anticipated. ? ?Time:  ?I spent 12 minutes w

## 2021-08-05 ENCOUNTER — Ambulatory Visit (INDEPENDENT_AMBULATORY_CARE_PROVIDER_SITE_OTHER): Payer: BC Managed Care – PPO | Admitting: Physician Assistant

## 2021-08-05 VITALS — BP 118/77 | HR 99 | Ht 64.0 in | Wt 346.0 lb

## 2021-08-05 DIAGNOSIS — F331 Major depressive disorder, recurrent, moderate: Secondary | ICD-10-CM

## 2021-08-05 DIAGNOSIS — E1165 Type 2 diabetes mellitus with hyperglycemia: Secondary | ICD-10-CM | POA: Diagnosis not present

## 2021-08-05 DIAGNOSIS — E1169 Type 2 diabetes mellitus with other specified complication: Secondary | ICD-10-CM | POA: Diagnosis not present

## 2021-08-05 DIAGNOSIS — I152 Hypertension secondary to endocrine disorders: Secondary | ICD-10-CM

## 2021-08-05 DIAGNOSIS — E1159 Type 2 diabetes mellitus with other circulatory complications: Secondary | ICD-10-CM | POA: Diagnosis not present

## 2021-08-05 DIAGNOSIS — E785 Hyperlipidemia, unspecified: Secondary | ICD-10-CM

## 2021-08-05 DIAGNOSIS — M5416 Radiculopathy, lumbar region: Secondary | ICD-10-CM | POA: Diagnosis not present

## 2021-08-05 DIAGNOSIS — F39 Unspecified mood [affective] disorder: Secondary | ICD-10-CM

## 2021-08-05 DIAGNOSIS — I10 Essential (primary) hypertension: Secondary | ICD-10-CM

## 2021-08-05 LAB — POCT GLYCOSYLATED HEMOGLOBIN (HGB A1C): Hemoglobin A1C: 6.5 % — AB (ref 4.0–5.6)

## 2021-08-05 MED ORDER — TIRZEPATIDE 5 MG/0.5ML ~~LOC~~ SOAJ
5.0000 mg | SUBCUTANEOUS | 2 refills | Status: DC
Start: 2021-08-05 — End: 2021-12-10

## 2021-08-05 MED ORDER — GABAPENTIN 300 MG PO CAPS: 300.0000 mg | ORAL_CAPSULE | Freq: Three times a day (TID) | ORAL | 3 refills | Status: DC

## 2021-08-05 MED ORDER — PIOGLITAZONE HCL 15 MG PO TABS
15.0000 mg | ORAL_TABLET | Freq: Every day | ORAL | 0 refills | Status: DC
Start: 2021-08-05 — End: 2021-11-03

## 2021-08-05 MED ORDER — LAMOTRIGINE 25 MG PO TABS: ORAL_TABLET | ORAL | 1 refills | Status: DC

## 2021-08-05 MED ORDER — VALSARTAN-HYDROCHLOROTHIAZIDE 320-25 MG PO TABS: 1.0000 | ORAL_TABLET | Freq: Every day | ORAL | 1 refills | Status: DC

## 2021-08-05 NOTE — Progress Notes (Signed)
? ?Established Patient Office Visit ? ?Subjective   ?Patient ID: Karen MadridBrynn Stokes, female    DOB: 05/06/1985  Age: 36 y.o. MRN: [id number] ? ?Chief Complaint  ?Patient presents with  ? Diabetes  ? ? ?HPI ?Pt is a 36 yo obese female who presents to the clinic for 3 month follow up on T2DM.  ? ?A1C was not controlled at last visit. Mounjaro started. She has not lost weight. She admits her diet and exercise have not been happening. She does feel more nauseated with mounjaro than ozempic. She has checked her sugars some and getting 130-180 range. No CP, palpitations, headaches or vision changes. No hypoglycemic events.  ? ?Pt pain is controlled with gabapentin.  ? ?Pt mood is good. No concerns.  ?Patient Active Problem List  ? Diagnosis Date Noted  ? Mood disorder (HCC) 08/12/2021  ? Left lumbar radiculitis 10/22/2020  ? Stress at home 03/29/2020  ? Moderate episode of recurrent major depressive disorder (HCC) 12/27/2019  ? Scalp itch 09/27/2019  ? Seasonal allergies 08/01/2019  ? Allergic conjunctivitis and rhinitis, bilateral 08/01/2019  ? Strain of rhomboid muscle 03/28/2019  ? History of shingles 12/21/2018  ? Hyperlipidemia associated with type 2 diabetes mellitus (HCC) 05/09/2018  ? Primary hypertension 05/09/2018  ? Compulsive behaviors 05/09/2018  ? Diabetic eye exam (HCC) 05/09/2018  ? Intractable migraine without aura and without status migrainosus 03/17/2018  ? Encounter for routine checking of intrauterine contraceptive device (IUD) 03/17/2018  ? Uncontrolled type 2 diabetes mellitus with hyperglycemia (HCC) 02/13/2018  ? Palpitations 02/01/2018  ? Class 3 severe obesity due to excess calories with serious comorbidity and body mass index (BMI) of 50.0 to 59.9 in adult [facility](HCC) 02/01/2018  ? Uncontrolled stage 2 hypertension 02/01/2018  ? Acute right-sided low back pain without sciatica 02/01/2018  ? Dyspnea on exertion 02/01/2018  ? GAD (generalized anxiety disorder) 02/01/2018  ? Severe episode of recurrent major  depressive disorder, without psychotic features (HCC) 02/01/2018  ? Eosinophilic esophagitis   ? Irritable bowel syndrome   ? ?Past Medical History:  ?Diagnosis Date  ? Allergy   ? Anxiety   ? Depression   ? Eosinophilic esophagitis   ? GERD (gastroesophageal reflux disease)   ? Hypertension   ? Irritable bowel syndrome   ? Obesity   ? ?Allergies  ?Allergen Reactions  ? Amoxicillin-Pot Clavulanate Nausea And Vomiting  ? Metformin And Related Diarrhea  ?  Severe GI upset  ? Morphine And Related Other (See Comments)  ?  headaches ?  ? Trileptal [Oxcarbazepine] Other (See Comments)  ?  hallucinations  ? ?  ? ?ROS ?See HPI.  ?  ?Objective:  ?  ? ?BP 118/77   Pulse 99   Ht 5\' 4"  (1.626 m)   Wt (!) 346 lb (156.9 kg)   SpO2 95%   BMI 59.39 kg/m?  ?BP Readings from Last 3 Encounters:  ?08/05/21 118/77  ?03/04/21 127/79  ?01/01/21 126/83  ? ?Wt Readings from Last 3 Encounters:  ?08/05/21 (!) 346 lb (156.9 kg)  ?03/04/21 (!) 354 lb (160.6 kg)  ?01/01/21 (!) 340 lb (154.2 kg)  ? ?  ? ?Physical Exam ?Vitals reviewed.  ?Constitutional:   ?   Appearance: Normal appearance. She is obese.  ?HENT:  ?   Head: Normocephalic.  ?Neck:  ?   Vascular: No carotid bruit.  ?Cardiovascular:  ?   Rate and Rhythm: Normal rate and regular rhythm.  ?   Pulses: Normal pulses.  ?   Heart sounds:  Normal heart sounds.  ?Pulmonary:  ?   Effort: Pulmonary effort is normal.  ?   Breath sounds: Normal breath sounds.  ?Musculoskeletal:  ?   Right lower leg: No edema.  ?   Left lower leg: No edema.  ?Lymphadenopathy:  ?   Cervical: No cervical adenopathy.  ?Neurological:  ?   General: No focal deficit present.  ?   Mental Status: She is alert and oriented to person, place, and time.  ?Psychiatric:     ?   Mood and Affect: Mood normal.  ? ? ? ?Results for orders placed or performed in visit on 08/05/21  ?POCT glycosylated hemoglobin (Hb A1C)  ?Result Value Ref Range  ? Hemoglobin A1C 6.5 (A) 4.0 - 5.6 %  ? HbA1c POC (<> result, manual entry)    ?  HbA1c, POC (prediabetic range)    ? HbA1c, POC (controlled diabetic range)    ? ?.. ? ?  08/05/2021  ? 10:24 AM 01/01/2021  ?  9:55 AM 03/27/2020  ?  9:13 AM 12/27/2019  ?  8:37 AM 09/27/2019  ?  7:18 AM  ?Depression screen PHQ 2/9  ?Decreased Interest 1 2 0 1 2  ?Down, Depressed, Hopeless 1 0 0 1 2  ?PHQ - 2 Score 2 2 0 2 4  ?Altered sleeping 1 3 2 2 3   ?Tired, decreased energy 2 3 1 2 3   ?Change in appetite 2 3 0 2 3  ?Feeling bad or failure about yourself  0 0 0 2 3  ?Trouble concentrating 2 0 0 2 3  ?Moving slowly or fidgety/restless 2 0 0 2 3  ?Suicidal thoughts 0 0 0 0 0  ?PHQ-9 Score 11 11 3 14 22   ?Difficult doing work/chores Somewhat difficult Somewhat difficult Not difficult at all Somewhat difficult Very difficult  ? ?.. ? ?  08/05/2021  ? 10:24 AM 01/01/2021  ?  9:55 AM 03/27/2020  ?  9:13 AM 12/27/2019  ?  8:37 AM  ?GAD 7 : Generalized Anxiety Score  ?Nervous, Anxious, on Edge 2 3 1 2   ?Control/stop worrying 1 2 1 2   ?Worry too much - different things 1 2 1 1   ?Trouble relaxing 2 2 1 2   ?Restless 2 2 1 1   ?Easily annoyed or irritable 1 2 0 2  ?Afraid - awful might happen 0 2 0 3  ?Total GAD 7 Score 9 15 5 13   ?Anxiety Difficulty Somewhat difficult Somewhat difficult Not difficult at all Somewhat difficult  ? ? ? ? ? ?  ?Assessment & Plan:  ?..Karen Stokes was seen today for diabetes. ? ?Diagnoses and all orders for this visit: ? ?Uncontrolled type 2 diabetes mellitus with hyperglycemia (HCC) ?-     POCT glycosylated hemoglobin (Hb A1C) ?-     tirzepatide [facility]) 5 MG/0.5ML Pen; Inject 5 mg into the skin once a week. ?-     pioglitazone (ACTOS) 15 MG tablet; Take 1 tablet (15 mg total) by mouth daily. ? ?Hypertension associated with diabetes (HCC) ? ?Hyperlipidemia associated with type 2 diabetes mellitus (HCC) ? ?Left lumbar radiculitis ?-     gabapentin (NEURONTIN) 300 MG capsule; Take 1 capsule (300 mg total) by mouth 3 (three) times daily. ? ?Moderate episode of recurrent major depressive disorder (HCC) ?-      lamoTRIgine (LAMICTAL) 25 MG tablet; Take two tablets daily. ? ?Mood disorder (HCC) ?-     lamoTRIgine (LAMICTAL) 25 MG tablet; Take two tablets daily. ? ?Primary hypertension ?-  valsartan-hydrochlorothiazide (DIOVAN-HCT) 320-25 MG tablet; Take 1 tablet by mouth daily. ? ?A1C to goal ?Stay on same medications ?Did not increase mounjaro due to GI upset on current dose and do not want to make worse ?BP to goal on ARB ?On STATIN ?Eye and foot exam UTD ?Covid vaccine x2 ?Flu and pneumonia shot UTD ?Follow up in 3 months.  ? ?Refilled gabapentin for radiculitis pain, well controlled.  ? ?PHQ and GAD stable ?Refilled lamictal.  ? ?.Discussed low carb diet with 1500 calories and 80g of protein.  ?Exercising at least 150 minutes a week.  ?[employer] could be a Chief Technology Officer.  ? ? ? ? ? ? ?Return in about 3 months (around 11/04/2021).  ? ? ?Tandy Gaw, PA-C ? ?

## 2021-08-12 ENCOUNTER — Encounter: Payer: Self-pay | Admitting: Physician Assistant

## 2021-08-12 DIAGNOSIS — F39 Unspecified mood [affective] disorder: Secondary | ICD-10-CM | POA: Insufficient documentation

## 2021-09-11 ENCOUNTER — Other Ambulatory Visit: Payer: Self-pay | Admitting: Physician Assistant

## 2021-09-11 DIAGNOSIS — F332 Major depressive disorder, recurrent severe without psychotic features: Secondary | ICD-10-CM

## 2021-09-11 DIAGNOSIS — F411 Generalized anxiety disorder: Secondary | ICD-10-CM

## 2021-10-31 ENCOUNTER — Other Ambulatory Visit: Payer: Self-pay | Admitting: Physician Assistant

## 2021-10-31 DIAGNOSIS — F411 Generalized anxiety disorder: Secondary | ICD-10-CM

## 2021-10-31 DIAGNOSIS — F332 Major depressive disorder, recurrent severe without psychotic features: Secondary | ICD-10-CM

## 2021-11-02 ENCOUNTER — Other Ambulatory Visit: Payer: Self-pay | Admitting: Physician Assistant

## 2021-11-02 DIAGNOSIS — E1165 Type 2 diabetes mellitus with hyperglycemia: Secondary | ICD-10-CM

## 2021-11-17 ENCOUNTER — Ambulatory Visit: Payer: BC Managed Care – PPO | Admitting: Physician Assistant

## 2021-11-17 ENCOUNTER — Telehealth: Payer: Self-pay

## 2021-11-17 DIAGNOSIS — E1165 Type 2 diabetes mellitus with hyperglycemia: Secondary | ICD-10-CM

## 2021-11-17 NOTE — Telephone Encounter (Signed)
Pt called stating that she was unable to take her valsartan/HCTZ medication for 3 days due to a problem with the pharmacy.  Today, she experienced a "terrible" headache and nose bleed.  She checked her BP and it was 163/96.  She did start the valsartan back this morning.  She states that she took Tylenol, which resolved the headache and the nose bleed stopped.  Advised pt to check BP tonight and again tomorrow morning 2 hours after she takes the valsartan and call our office for appt if BP is still elevated.  Pt expressed understanding and is agreeable.  Tiajuana Amass, CMA

## 2021-11-22 ENCOUNTER — Other Ambulatory Visit: Payer: Self-pay | Admitting: Physician Assistant

## 2021-11-22 DIAGNOSIS — F411 Generalized anxiety disorder: Secondary | ICD-10-CM

## 2021-11-22 DIAGNOSIS — F439 Reaction to severe stress, unspecified: Secondary | ICD-10-CM

## 2021-11-24 NOTE — Telephone Encounter (Signed)
Last written 06/11/2021 #60 with 2 refills Last appt 08/05/2021, scheduled 12/10/2021

## 2021-12-10 ENCOUNTER — Encounter: Payer: Self-pay | Admitting: Physician Assistant

## 2021-12-10 ENCOUNTER — Ambulatory Visit (INDEPENDENT_AMBULATORY_CARE_PROVIDER_SITE_OTHER): Payer: BC Managed Care – PPO | Admitting: Physician Assistant

## 2021-12-10 VITALS — BP 129/74 | HR 95 | Wt 357.0 lb

## 2021-12-10 DIAGNOSIS — F331 Major depressive disorder, recurrent, moderate: Secondary | ICD-10-CM | POA: Diagnosis not present

## 2021-12-10 DIAGNOSIS — Z9109 Other allergy status, other than to drugs and biological substances: Secondary | ICD-10-CM

## 2021-12-10 DIAGNOSIS — E1165 Type 2 diabetes mellitus with hyperglycemia: Secondary | ICD-10-CM | POA: Diagnosis not present

## 2021-12-10 DIAGNOSIS — E1169 Type 2 diabetes mellitus with other specified complication: Secondary | ICD-10-CM

## 2021-12-10 DIAGNOSIS — I152 Hypertension secondary to endocrine disorders: Secondary | ICD-10-CM

## 2021-12-10 DIAGNOSIS — E1159 Type 2 diabetes mellitus with other circulatory complications: Secondary | ICD-10-CM

## 2021-12-10 DIAGNOSIS — E785 Hyperlipidemia, unspecified: Secondary | ICD-10-CM

## 2021-12-10 DIAGNOSIS — J302 Other seasonal allergic rhinitis: Secondary | ICD-10-CM

## 2021-12-10 DIAGNOSIS — F39 Unspecified mood [affective] disorder: Secondary | ICD-10-CM

## 2021-12-10 LAB — POCT GLYCOSYLATED HEMOGLOBIN (HGB A1C): Hemoglobin A1C: 9.1 % — AB (ref 4.0–5.6)

## 2021-12-10 MED ORDER — MONTELUKAST SODIUM 10 MG PO TABS: ORAL_TABLET | ORAL | 3 refills | Status: DC

## 2021-12-10 MED ORDER — BUPROPION HCL 100 MG PO TABS: 100.0000 mg | ORAL_TABLET | Freq: Two times a day (BID) | ORAL | 1 refills | Status: DC

## 2021-12-10 MED ORDER — LAMOTRIGINE 25 MG PO TABS: ORAL_TABLET | ORAL | 1 refills | Status: DC

## 2021-12-10 MED ORDER — GLIPIZIDE 10 MG PO TABS
10.0000 mg | ORAL_TABLET | Freq: Two times a day (BID) | ORAL | 0 refills | Status: DC
Start: 2021-12-10 — End: 2022-03-16

## 2021-12-10 MED ORDER — SITAGLIPTIN PHOSPHATE 100 MG PO TABS
100.0000 mg | ORAL_TABLET | Freq: Every day | ORAL | 0 refills | Status: DC
Start: 2021-12-10 — End: 2022-06-02

## 2021-12-10 MED ORDER — DAPAGLIFLOZIN PROPANEDIOL 10 MG PO TABS: 10.0000 mg | ORAL_TABLET | Freq: Every day | ORAL | 0 refills | Status: DC

## 2021-12-10 NOTE — Progress Notes (Signed)
Established Patient Office Visit  Subjective   Patient ID: Karen Stokes, female    DOB: 22-Feb-1986  Age: 36 y.o. MRN: [id number]  Chief Complaint  Patient presents with   Follow-up    Routine follow up and A1c    HPI Pt is a 36 yo obese female with T2DM, HTN, HLD, MDD who presents to the clinic for follow up.   She is not checking sugars. She has been out of medications for at least 2 months. Insurance will not pay for mounjaro anymore and went from 25 dollars to 350. She was scared about the side effects of actos. No open sores or wounds. No hypoglycemic events. No CP, palpitations, headaches or vision changes.   .. Active Ambulatory Problems    Diagnosis Date Noted   Eosinophilic esophagitis    Irritable bowel syndrome    Palpitations 02/01/2018   Class 3 severe obesity due to excess calories with serious comorbidity and body mass index (BMI) of 50.0 to 59.9 in adult (HCC) 02/01/2018   Uncontrolled stage 2 hypertension 02/01/2018   Acute right-sided low back pain without sciatica 02/01/2018   Dyspnea on exertion 02/01/2018   GAD (generalized anxiety disorder) 02/01/2018   Severe episode of recurrent major depressive disorder, without psychotic features (HCC) 02/01/2018   Uncontrolled type 2 diabetes mellitus with hyperglycemia (HCC) 02/13/2018   Intractable migraine without aura and without status migrainosus 03/17/2018   Encounter for routine checking of intrauterine contraceptive device (IUD) 03/17/2018   Hyperlipidemia associated with type 2 diabetes mellitus (HCC) 05/09/2018   Primary hypertension 05/09/2018   Compulsive behaviors 05/09/2018   Diabetic eye exam (HCC) 05/09/2018   History of shingles 12/21/2018   Strain of rhomboid muscle 03/28/2019   Seasonal allergies 08/01/2019   Allergic conjunctivitis and rhinitis, bilateral 08/01/2019   Scalp itch 09/27/2019   Moderate episode of recurrent major depressive disorder (HCC) 12/27/2019   Stress at home 03/29/2020    Left lumbar radiculitis 10/22/2020   Mood disorder (HCC) 08/12/2021   Environmental allergies 12/10/2021   Resolved Ambulatory Problems    Diagnosis Date Noted   No Resolved Ambulatory Problems   Past Medical History:  Diagnosis Date   Allergy    Anxiety    Depression    GERD (gastroesophageal reflux disease)    Hypertension    Obesity      ROS See HPI.    Objective:     BP 129/74   Pulse 95   Wt (!) 357 lb (161.9 kg)   SpO2 98%   BMI 61.28 kg/m  BP Readings from Last 3 Encounters:  12/10/21 129/74  08/05/21 118/77  03/04/21 127/79   Wt Readings from Last 3 Encounters:  12/10/21 (!) 357 lb (161.9 kg)  08/05/21 (!) 346 lb (156.9 kg)  03/04/21 (!) 354 lb (160.6 kg)    .Marland Kitchen    12/10/2021    9:16 AM 08/05/2021   10:24 AM 01/01/2021    9:55 AM 03/27/2020    9:13 AM 12/27/2019    8:37 AM  Depression screen PHQ 2/9  Decreased Interest 2 1 2  0 1  Down, Depressed, Hopeless 2 1 0 0 1  PHQ - 2 Score 4 2 2  0 2  Altered sleeping 3 1 3 2 2   Tired, decreased energy 3 2 3 1 2   Change in appetite 2 2 3  0 2  Feeling bad or failure about yourself  1 0 0 0 2  Trouble concentrating 1 2 0 0 2  Moving  slowly or fidgety/restless 0 2 0 0 2  Suicidal thoughts 0 0 0 0 0  PHQ-9 Score 14 11 11 3 14   Difficult doing work/chores Extremely dIfficult Somewhat difficult Somewhat difficult Not difficult at all Somewhat difficult   .    12/10/2021    9:17 AM 08/05/2021   10:24 AM 01/01/2021    9:55 AM 03/27/2020    9:13 AM  GAD 7 : Generalized Anxiety Score  Nervous, Anxious, on Edge 2 2 3 1   Control/stop worrying 2 1 2 1   Worry too much - different things 2 1 2 1   Trouble relaxing 2 2 2 1   Restless 1 2 2 1   Easily annoyed or irritable 1 1 2  0  Afraid - awful might happen 1 0 2 0  Total GAD 7 Score 11 9 15 5   Anxiety Difficulty Extremely difficult Somewhat difficult Somewhat difficult Not difficult at all    .03/29/2020 Results for orders placed or performed in visit on 12/10/21  POCT  glycosylated hemoglobin (Hb A1C)  Result Value Ref Range   Hemoglobin A1C 9.1 (A) 4.0 - 5.6 %   HbA1c POC (<> result, manual entry)     HbA1c, POC (prediabetic range)     HbA1c, POC (controlled diabetic range)       Physical Exam Constitutional:      Appearance: Normal appearance. She is obese.  HENT:     Head: Normocephalic.  Cardiovascular:     Rate and Rhythm: Normal rate and regular rhythm.     Pulses: Normal pulses.     Heart sounds: Normal heart sounds.  Pulmonary:     Breath sounds: Normal breath sounds.  Musculoskeletal:     Right lower leg: No edema.     Left lower leg: No edema.  Neurological:     General: No focal deficit present.     Mental Status: She is alert and oriented to person, place, and time.  Psychiatric:        Mood and Affect: Mood normal.        Behavior: Behavior normal.        Assessment & Plan:   Karen Stokes was seen today for follow-up.  Diagnoses and all orders for this visit:  Uncontrolled type 2 diabetes mellitus with hyperglycemia (HCC) -     POCT glycosylated hemoglobin (Hb A1C) -     POCT UA - Microalbumin -     sitaGLIPtin (JANUVIA) 100 MG tablet; Take 1 tablet (100 mg total) by mouth daily. -     glipiZIDE (GLUCOTROL) 10 MG tablet; Take 1 tablet (10 mg total) by mouth 2 (two) times daily before a meal. -     dapagliflozin propanediol (FARXIGA) 10 MG TABS tablet; Take 1 tablet (10 mg total) by mouth daily.  Hypertension associated with diabetes (HCC)  Hyperlipidemia associated with type 2 diabetes mellitus (HCC)  Moderate episode of recurrent major depressive disorder (HCC) -     buPROPion (WELLBUTRIN) 100 MG tablet; Take 1 tablet (100 mg total) by mouth 2 (two) times daily. -     lamoTRIgine (LAMICTAL) 25 MG tablet; Take two tablets daily.  Mood disorder (HCC) -     lamoTRIgine (LAMICTAL) 25 MG tablet; Take two tablets daily.  Seasonal allergies -     montelukast (SINGULAIR) 10 MG tablet; TAKE 1 TABLET BY MOUTH EVERYDAY AT  BEDTIME  Environmental allergies -     montelukast (SINGULAIR) 10 MG tablet; TAKE 1 TABLET BY MOUTH EVERYDAY AT BEDTIME   A1C  is not to goal. Added januvia, glipizide, farxiga GLP too expensive Metformin GI side effects Discussed DM diet On statin BP to goal Needs eye exam Declined flu shot Pneumonia vaccine UTD Follow up in 3 months  Mood worsening but patient feels like due to sugars out of control and frustration with not being able to get medications. She does not want any adjustments today.    Return in about 3 months (around 03/12/2022).    Tandy Gaw, PA-C

## 2021-12-10 NOTE — Patient Instructions (Addendum)
Start glipizide twice a day Januvia once a day [place] once a day  Diabetes Mellitus and Nutrition, Adult When you have diabetes, or diabetes mellitus, it is very important to have healthy eating habits because your blood sugar (glucose) levels are greatly affected by what you eat and drink. Eating healthy foods in the right amounts, at about the same times every day, can help you: Manage your blood glucose. Lower your risk of heart disease. Improve your blood pressure. Reach or maintain a healthy weight. What can affect my meal plan? Every person with diabetes is different, and each person has different needs for a meal plan. Your health care provider may recommend that you work with a dietitian to make a meal plan that is best for you. Your meal plan may vary depending on factors such as: The calories you need. The medicines you take. Your weight. Your blood glucose, blood pressure, and cholesterol levels. Your activity level. Other health conditions you have, such as heart or kidney disease. How do carbohydrates affect me? Carbohydrates, also called carbs, affect your blood glucose level more than any other type of food. Eating carbs raises the amount of glucose in your blood. It is important to know how many carbs you can safely have in each meal. This is different for every person. Your dietitian can help you calculate how many carbs you should have at each meal and for each snack. How does alcohol affect me? Alcohol can cause a decrease in blood glucose (hypoglycemia), especially if you use insulin or take certain diabetes medicines by mouth. Hypoglycemia can be a life-threatening condition. Symptoms of hypoglycemia, such as sleepiness, dizziness, and confusion, are similar to symptoms of having too much alcohol. Do not drink alcohol if: Your health care provider tells you not to drink. You are pregnant, may be pregnant, or are planning to become pregnant. If you drink alcohol: Limit  how much you have to: 0-1 drink a day for women. 0-2 drinks a day for men. Know how much alcohol is in your drink. In the [place] one drink equals one 12 oz bottle of beer (355 mL), one 5 oz glass of wine (148 mL), or one 1 oz glass of hard liquor (44 mL). Keep yourself hydrated with water, diet soda, or unsweetened iced tea. Keep in mind that regular soda, juice, and other mixers may contain a lot of sugar and must be counted as carbs. What are tips for following this plan?  Reading food labels Start by checking the serving size on the Nutrition Facts label of packaged foods and drinks. The number of calories and the amount of carbs, fats, and other nutrients listed on the label are based on one serving of the item. Many items contain more than one serving per package. Check the total grams (g) of carbs in one serving. Check the number of grams of saturated fats and trans fats in one serving. Choose foods that have a low amount or none of these fats. Check the number of milligrams (mg) of salt (sodium) in one serving. Most people should limit total sodium intake to less than 2,300 mg per day. Always check the nutrition information of foods labeled as "low-fat" or "nonfat." These foods may be higher in added sugar or refined carbs and should be avoided. Talk to your dietitian to identify your daily goals for nutrients listed on the label. Shopping Avoid buying canned, pre-made, or processed foods. These foods tend to be high in fat, sodium, and added  sugar. Shop around the outside edge of the grocery store. This is where you will most often find fresh fruits and vegetables, bulk grains, fresh meats, and fresh dairy products. Cooking Use low-heat cooking methods, such as baking, instead of high-heat cooking methods, such as deep frying. Cook using healthy oils, such as olive, canola, or sunflower oil. Avoid cooking with butter, cream, or high-fat meats. Meal planning Eat meals and snacks  regularly, preferably at the same times every day. Avoid going long periods of time without eating. Eat foods that are high in fiber, such as fresh fruits, vegetables, beans, and whole grains. Eat 4-6 oz (112-168 g) of lean protein each day, such as lean meat, chicken, fish, eggs, or tofu. One ounce (oz) (28 g) of lean protein is equal to: 1 oz (28 g) of meat, chicken, or fish. 1 egg.  cup (62 g) of tofu. Eat some foods each day that contain healthy fats, such as avocado, nuts, seeds, and fish. What foods should I eat? Fruits Berries. Apples. Oranges. Peaches. Apricots. Plums. Grapes. Mangoes. Papayas. Pomegranates. Kiwi. Cherries. Vegetables Leafy greens, including lettuce, spinach, kale, chard, collard greens, mustard greens, and cabbage. Beets. Cauliflower. Broccoli. Carrots. Green beans. Tomatoes. Peppers. Onions. Cucumbers. Brussels sprouts. Grains Whole grains, such as whole-wheat or whole-grain bread, crackers, tortillas, cereal, and pasta. Unsweetened oatmeal. Quinoa. Brown or wild rice. Meats and other proteins Seafood. Poultry without skin. Lean cuts of poultry and beef. Tofu. Nuts. Seeds. Dairy Low-fat or fat-free dairy products such as milk, yogurt, and cheese. The items listed above may not be a complete list of foods and beverages you can eat and drink. Contact a dietitian for more information. What foods should I avoid? Fruits Fruits canned with syrup. Vegetables Canned vegetables. Frozen vegetables with butter or cream sauce. Grains Refined white flour and flour products such as bread, pasta, snack foods, and cereals. Avoid all processed foods. Meats and other proteins Fatty cuts of meat. Poultry with skin. Breaded or fried meats. Processed meat. Avoid saturated fats. Dairy Full-fat yogurt, cheese, or milk. Beverages Sweetened drinks, such as soda or iced tea. The items listed above may not be a complete list of foods and beverages you should avoid. Contact a  dietitian for more information. Questions to ask a health care provider Do I need to meet with a certified diabetes care and education specialist? Do I need to meet with a dietitian? What number can I call if I have questions? When are the best times to check my blood glucose? Where to find more information: American Diabetes Association: diabetes.org Academy of Nutrition and Dietetics: eatright.[employer] of Diabetes and Digestive and Kidney Diseases: StageSync.si Association of Diabetes Care & Education Specialists: diabeteseducator.org Summary It is important to have healthy eating habits because your blood sugar (glucose) levels are greatly affected by what you eat and drink. It is important to use alcohol carefully. A healthy meal plan will help you manage your blood glucose and lower your risk of heart disease. Your health care provider may recommend that you work with a dietitian to make a meal plan that is best for you. This information is not intended to replace advice given to you by your health care provider. Make sure you discuss any questions you have with your health care provider. Document Revised: 11/01/2019 Document Reviewed: 11/01/2019 Elsevier Patient Education  2023 [employer].

## 2021-12-18 ENCOUNTER — Encounter: Payer: Self-pay | Admitting: Physician Assistant

## 2021-12-30 ENCOUNTER — Encounter: Payer: Self-pay | Admitting: Physician Assistant

## 2021-12-30 ENCOUNTER — Ambulatory Visit (INDEPENDENT_AMBULATORY_CARE_PROVIDER_SITE_OTHER): Payer: BC Managed Care – PPO | Admitting: Physician Assistant

## 2021-12-30 VITALS — BP 118/71 | HR 105 | Ht 64.0 in | Wt 346.0 lb

## 2021-12-30 DIAGNOSIS — E785 Hyperlipidemia, unspecified: Secondary | ICD-10-CM | POA: Diagnosis not present

## 2021-12-30 DIAGNOSIS — Z0000 Encounter for general adult medical examination without abnormal findings: Secondary | ICD-10-CM

## 2021-12-30 DIAGNOSIS — E1165 Type 2 diabetes mellitus with hyperglycemia: Secondary | ICD-10-CM | POA: Diagnosis not present

## 2021-12-30 DIAGNOSIS — E1169 Type 2 diabetes mellitus with other specified complication: Secondary | ICD-10-CM | POA: Diagnosis not present

## 2021-12-30 LAB — POCT GLYCOSYLATED HEMOGLOBIN (HGB A1C)

## 2021-12-30 LAB — POCT UA - MICROALBUMIN
Albumin/Creatinine Ratio, Urine, POC: <30
Creatinine, POC: 200 mg/dL
Microalbumin Ur, POC: 10 mg/L

## 2021-12-30 NOTE — Progress Notes (Signed)
Complete physical exam  Patient: Karen Stokes   DOB: [birth year]   36 y.o. Female  MRN: [id number]  Subjective:    Chief Complaint  Patient presents with   Annual Exam    Karen Stokes is a 36 y.o. female who presents today for a complete physical exam. She reports consuming a general diet. The patient does not participate in regular exercise at present. She generally feels well. She reports sleeping well. She does have additional problems to discuss today.   Her sugars have been decreasing. They were ranging in the high 200s and now most often in the 150s-170s.   Most recent fall risk assessment:    12/10/2021    9:17 AM  Fall Risk   Falls in the past year? 0  Number falls in past yr: 0  Injury with Fall? 0  Risk for fall due to : No Fall Risks  Follow up Falls evaluation completed     Most recent depression screenings:    12/10/2021    9:16 AM 08/05/2021   10:24 AM  PHQ 2/9 Scores  PHQ - 2 Score 4 2  PHQ- 9 Score 14 11    Vision:Within last year and Dental: No current dental problems  Patient Active Problem List   Diagnosis Date Noted   Environmental allergies 12/10/2021   Mood disorder ([place]) 08/12/2021   Left lumbar radiculitis 10/22/2020   Stress at home 03/29/2020   Moderate episode of recurrent major depressive disorder ([place]) 12/27/2019   Scalp itch 09/27/2019   Seasonal allergies 08/01/2019   Allergic conjunctivitis and rhinitis, bilateral 08/01/2019   Strain of rhomboid muscle 03/28/2019   History of shingles 12/21/2018   Hyperlipidemia associated with type 2 diabetes mellitus ([place]) 05/09/2018   Primary hypertension 05/09/2018   Compulsive behaviors 05/09/2018   Diabetic eye exam ([place]) 05/09/2018   Intractable migraine without aura and without status migrainosus 03/17/2018   Encounter for routine checking of intrauterine contraceptive device (IUD) 03/17/2018   Uncontrolled type 2 diabetes mellitus with hyperglycemia ([place]) 02/13/2018   Palpitations  02/01/2018   Class 3 severe obesity due to excess calories with serious comorbidity and body mass index (BMI) of 50.0 to 59.9 in adult ([place]) 02/01/2018   Uncontrolled stage 2 hypertension 02/01/2018   Acute right-sided low back pain without sciatica 02/01/2018   Dyspnea on exertion 02/01/2018   GAD (generalized anxiety disorder) 02/01/2018   Severe episode of recurrent major depressive disorder, without psychotic features ([place]) 10/04/7626   Eosinophilic esophagitis    Irritable bowel syndrome    Past Medical History:  Diagnosis Date   Allergy    Anxiety    Depression    Eosinophilic esophagitis    GERD (gastroesophageal reflux disease)    Hypertension    Irritable bowel syndrome    Obesity    Family History  Problem Relation Age of Onset   Irritable bowel syndrome Mother    Anxiety disorder Mother    Depression Mother    Cirrhosis Father    Hypertension Maternal Grandfather    Heart attack Maternal Grandfather    Stroke Maternal Grandfather    Allergies  Allergen Reactions   Amoxicillin-Pot Clavulanate Nausea And Vomiting   Metformin And Related Diarrhea    Severe GI upset   Morphine And Related Other (See Comments)    headaches    Trileptal [Oxcarbazepine] Other (See Comments)    hallucinations      Patient Care Team: Lavada Mesi as PCP - General (Family Medicine)  Barbaraann Share, MD as Consulting Physician (Gastroenterology)   Outpatient Medications Prior to Visit  Medication Sig   atorvastatin (LIPITOR) 80 MG tablet Take 1 tablet (80 mg total) by mouth daily.   azelastine (OPTIVAR) 0.05 % ophthalmic solution Place 1 drop into both eyes 2 (two) times daily.   buPROPion (WELLBUTRIN) 100 MG tablet Take 1 tablet (100 mg total) by mouth 2 (two) times daily.   clonazePAM (KLONOPIN) 0.5 MG tablet TAKE 1 TABLET BY MOUTH TWICE A DAY AS NEEDED FOR ANXIETY   dapagliflozin propanediol (FARXIGA) 10 MG TABS tablet Take 1 tablet (10 mg total) by mouth daily.    diphenhydrAMINE (BENADRYL) 25 MG tablet Take 25 mg by mouth at bedtime.   FLUoxetine (PROZAC) 40 MG capsule TAKE 1 CAPSULE BY MOUTH DAILY   gabapentin (NEURONTIN) 300 MG capsule Take 1 capsule (300 mg total) by mouth 3 (three) times daily.   glipiZIDE (GLUCOTROL) 10 MG tablet Take 1 tablet (10 mg total) by mouth 2 (two) times daily before a meal.   ibuprofen (ADVIL) 800 MG tablet Take 1 tablet (800 mg total) by mouth every 8 (eight) hours as needed.   lamoTRIgine (LAMICTAL) 25 MG tablet Take two tablets daily.   loratadine (CLARITIN) 10 MG tablet Take 20 mg by mouth daily.   montelukast (SINGULAIR) 10 MG tablet TAKE 1 TABLET BY MOUTH EVERYDAY AT BEDTIME   omeprazole (PRILOSEC) 40 MG capsule    saline (AYR) GEL Place 1 application into both nostrils.   sitaGLIPtin (JANUVIA) 100 MG tablet Take 1 tablet (100 mg total) by mouth daily.   valsartan-hydrochlorothiazide (DIOVAN-HCT) 320-25 MG tablet Take 1 tablet by mouth daily.   No facility-administered medications prior to visit.    Review of Systems  All other systems reviewed and are negative.         Objective:     BP (!) 143/78   Pulse (!) 108   Ht 5\' 4"  (1.626 m)   Wt (!) 346 lb (156.9 kg)   SpO2 97%   BMI 59.39 kg/m  BP Readings from Last 3 Encounters:  12/30/21 (!) 143/78  12/10/21 129/74  08/05/21 118/77   Wt Readings from Last 3 Encounters:  12/30/21 (!) 346 lb (156.9 kg)  12/10/21 (!) 357 lb (161.9 kg)  08/05/21 (!) 346 lb (156.9 kg)    .08/07/21 Results for orders placed or performed in visit on 12/30/21  POCT glycosylated hemoglobin (Hb A1C)  Result Value Ref Range   Hemoglobin A1C 8.9 (A) 4.0 - 5.6 %   HbA1c POC (<> result, manual entry)     HbA1c, POC (prediabetic range)     HbA1c, POC (controlled diabetic range)    POCT UA - Microalbumin  Result Value Ref Range   Microalbumin Ur, POC 10 mg/L   Creatinine, POC 200 mg/dL   Albumin/Creatinine Ratio, Urine, POC <30      Physical Exam  BP 118/71   Pulse (!)  105   Ht 5\' 4"  (1.626 m)   Wt (!) 346 lb (156.9 kg)   SpO2 97%   BMI 59.39 kg/m   General Appearance:    Alert, cooperative, obese no distress, appears stated age  Head:    Normocephalic, without obvious abnormality, atraumatic  Eyes:    PERRL, conjunctiva/corneas clear, EOM's intact, fundi    benign, both eyes  Ears:    Normal TM's and external ear canals, both ears  Nose:   Nares normal, septum midline, mucosa normal, no drainage  or sinus tenderness  Throat:   Lips, mucosa, and tongue normal; teeth and gums normal  Neck:   Supple, symmetrical, trachea midline, no adenopathy;    thyroid:  no enlargement/tenderness/nodules; no carotid   bruit or JVD  Back:     Symmetric, no curvature, ROM normal, no CVA tenderness  Lungs:     Clear to auscultation bilaterally, respirations unlabored  Chest Wall:    No tenderness or deformity   Heart:    Regular rate and rhythm, S1 and S2 normal, no murmur, rub   or gallop     Abdomen:     Soft, non-tender, bowel sounds active all four quadrants,    no masses, no organomegaly        Extremities:   Extremities normal, atraumatic, no cyanosis or edema  Pulses:   2+ and symmetric all extremities  Skin:   Skin color, texture, turgor normal, no rashes or lesions  Lymph nodes:   Cervical, supraclavicular, and axillary nodes normal  Neurologic:   CNII-XII intact, normal strength, sensation and reflexes    throughout      Assessment & Plan:    Routine Health Maintenance and Physical Exam  Immunization History  Administered Date(s) Administered   Influenza,inj,Quad PF,6+ Mos 12/27/2019   PFIZER(Purple Top)SARS-COV-2 Vaccination 07/27/2019, 08/22/2019   Pneumococcal Polysaccharide-23 12/27/2019    Health Maintenance  Topic Date Due   Diabetic kidney evaluation - Urine ACR  Never done   OPHTHALMOLOGY EXAM  12/30/2021 (Originally 10/01/2021)   COVID-19 Vaccine (3 - Pfizer risk series) 01/15/2022 (Originally 09/19/2019)   INFLUENZA VACCINE   07/12/2022 (Originally 11/11/2021)   Diabetic kidney evaluation - GFR measurement  03/04/2022   HEMOGLOBIN A1C  06/11/2022   FOOT EXAM  08/06/2022   PAP SMEAR-Modifier  02/15/2023   TETANUS/TDAP  10/15/2023   Hepatitis C Screening  Completed   HPV VACCINES  Aged Out    Discussed health benefits of physical activity, and encouraged her to engage in regular exercise appropriate for her age and condition.  Marland KitchenTimmothy Stokes was seen today for annual exam.  Diagnoses and all orders for this visit:  Routine physical examination -     Lipid Panel w/reflex Direct LDL -     COMPLETE METABOLIC PANEL WITH GFR -     CBC w/Diff/Platelet -     TSH  Uncontrolled type 2 diabetes mellitus with hyperglycemia (HCC) -     POCT glycosylated hemoglobin (Hb A1C) -     POCT UA - Microalbumin -     COMPLETE METABOLIC PANEL WITH GFR  Hyperlipidemia associated with type 2 diabetes mellitus (HCC) -     Lipid Panel w/reflex Direct LDL   .Marland Kitchen Discussed 150 minutes of exercise a week.  Encouraged vitamin D 1000 units and Calcium 1300mg  or 4 servings of dairy a day.  Fasting labs ordered PHQ no concerns Pap UTD Declined flu shot today but states she will get at pharmacy Still not able to get approved On glipizide and farxiga for DM sugars are coming down Recheck a1c in 3 months      [place], PA-C

## 2021-12-31 ENCOUNTER — Other Ambulatory Visit: Payer: Self-pay | Admitting: Physician Assistant

## 2021-12-31 LAB — LIPID PANEL W/REFLEX DIRECT LDL
Cholesterol: 163 mg/dL (ref <200)
HDL: 40 mg/dL — ABNORMAL LOW (ref >=50)
LDL Cholesterol (Calc): 91 mg/dL (calc)
Non-HDL Cholesterol (Calc): 123 mg/dL (calc) (ref <130)
Total CHOL/HDL Ratio: 4.1 (calc) (ref <5.0)
Triglycerides: 219 mg/dL — ABNORMAL HIGH (ref <150)

## 2021-12-31 LAB — COMPLETE METABOLIC PANEL WITH GFR
AG Ratio: 1.7 (calc) (ref 1.0–2.5)
ALT: 26 U/L (ref 6–29)
AST: 16 U/L (ref 10–30)
Albumin: 4.3 g/dL (ref 3.6–5.1)
Alkaline phosphatase (APISO): 107 U/L (ref 31–125)
BUN/Creatinine Ratio: 13 (calc) (ref 6–22)
BUN: 13 mg/dL (ref 7–25)
CO2: 24 mmol/L (ref 20–32)
Calcium: 9.1 mg/dL (ref 8.6–10.2)
Chloride: 99 mmol/L (ref 98–110)
Creat: 1.02 mg/dL — ABNORMAL HIGH (ref 0.50–0.97)
Globulin: 2.6 g/dL (calc) (ref 1.9–3.7)
Glucose, Bld: 220 mg/dL — ABNORMAL HIGH (ref 65–99)
Potassium: 3.8 mmol/L (ref 3.5–5.3)
Sodium: 138 mmol/L (ref 135–146)
Total Bilirubin: 0.5 mg/dL (ref 0.2–1.2)
Total Protein: 6.9 g/dL (ref 6.1–8.1)
eGFR: 74 mL/min/{1.73_m2} (ref >=60)

## 2021-12-31 LAB — CBC WITH DIFFERENTIAL/PLATELET
Absolute Monocytes: 673 cells/uL (ref 200–950)
Basophils Absolute: 35 cells/uL (ref 0–200)
Basophils Relative: 0.3 %
Eosinophils Absolute: 220 cells/uL (ref 15–500)
Eosinophils Relative: 1.9 %
HCT: 40.3 % (ref 35.0–45.0)
Hemoglobin: 13.5 g/dL (ref 11.7–15.5)
Lymphs Abs: 2830 cells/uL (ref 850–3900)
MCH: 28.8 pg (ref 27.0–33.0)
MCHC: 33.5 g/dL (ref 32.0–36.0)
MCV: 86.1 fL (ref 80.0–100.0)
MPV: 11.5 fL (ref 7.5–12.5)
Monocytes Relative: 5.8 %
Neutro Abs: 7842 cells/uL — ABNORMAL HIGH (ref 1500–7800)
Neutrophils Relative %: 67.6 %
Platelets: 335 10*3/uL (ref 140–400)
RBC: 4.68 10*6/uL (ref 3.80–5.10)
RDW: 12.6 % (ref 11.0–15.0)
Total Lymphocyte: 24.4 %
WBC: 11.6 10*3/uL — ABNORMAL HIGH (ref 3.8–10.8)

## 2021-12-31 LAB — TSH: TSH: 1.87 mIU/L

## 2021-12-31 MED ORDER — ATORVASTATIN CALCIUM 80 MG PO TABS
80.0000 mg | ORAL_TABLET | Freq: Every day | ORAL | 3 refills | Status: DC
Start: 2021-12-31 — End: 2023-01-06

## 2021-12-31 NOTE — Progress Notes (Signed)
[place],  WBC and neutrophils up some could be fighting off illness.  Thyroid looks good.  Have you been taking lipitor 80mg  daily. You are still not at LDL goal of under 70.  TG would likely decrease once glucose is to goal.

## 2022-01-02 ENCOUNTER — Telehealth: Payer: Self-pay

## 2022-01-02 MED ORDER — EZETIMIBE 10 MG PO TABS: 10.0000 mg | ORAL_TABLET | Freq: Every day | ORAL | 3 refills | Status: DC

## 2022-01-02 NOTE — Telephone Encounter (Addendum)
Initiated Prior authorization [id number] 100MG  tablets Via: Covermymeds Case/Key: [id number] Status: approved  as of 01/02/22 Reason:approved through 01/03/2023. Notified Pt via: Mychart

## 2022-01-02 NOTE — Addendum Note (Signed)
Addended by: Donella Stade on: 01/02/2022 12:53 PM   Modules accepted: Orders

## 2022-01-05 MED ORDER — PIOGLITAZONE HCL 15 MG PO TABS: 15.0000 mg | ORAL_TABLET | Freq: Every day | ORAL | 0 refills | Status: DC

## 2022-01-05 NOTE — Telephone Encounter (Signed)
Actos sent to pharmacy

## 2022-01-30 ENCOUNTER — Telehealth: Payer: Self-pay

## 2022-01-30 NOTE — Telephone Encounter (Signed)
Patient dropped paperwork off to be filled out for insurance. Paperwork placed in the basket. tvt

## 2022-02-02 NOTE — Telephone Encounter (Signed)
Forms completed, copy sent to scan, copy to hold. Given to front desk to collect payment.   

## 2022-02-09 ENCOUNTER — Other Ambulatory Visit: Payer: Self-pay | Admitting: Physician Assistant

## 2022-02-09 DIAGNOSIS — E1165 Type 2 diabetes mellitus with hyperglycemia: Secondary | ICD-10-CM

## 2022-02-10 ENCOUNTER — Other Ambulatory Visit: Payer: Self-pay | Admitting: Physician Assistant

## 2022-02-10 DIAGNOSIS — I10 Essential (primary) hypertension: Secondary | ICD-10-CM

## 2022-03-02 ENCOUNTER — Other Ambulatory Visit: Payer: Self-pay | Admitting: Physician Assistant

## 2022-03-02 DIAGNOSIS — F439 Reaction to severe stress, unspecified: Secondary | ICD-10-CM

## 2022-03-02 DIAGNOSIS — F411 Generalized anxiety disorder: Secondary | ICD-10-CM

## 2022-03-03 NOTE — Telephone Encounter (Signed)
Last written 11/24/2021 #60 with one refill Last appt 12/30/2021

## 2022-03-15 ENCOUNTER — Other Ambulatory Visit: Payer: Self-pay | Admitting: Physician Assistant

## 2022-03-15 DIAGNOSIS — E1165 Type 2 diabetes mellitus with hyperglycemia: Secondary | ICD-10-CM

## 2022-03-17 ENCOUNTER — Ambulatory Visit: Payer: BC Managed Care – PPO | Admitting: Physician Assistant

## 2022-03-24 ENCOUNTER — Ambulatory Visit
Admission: EM | Admit: 2022-03-24 | Discharge: 2022-03-24 | Disposition: A | Discharge status: Home / Self Care | Payer: BC Managed Care – PPO | Attending: Family Medicine | Admitting: Family Medicine

## 2022-03-24 DIAGNOSIS — H02841 Edema of right upper eyelid: Secondary | ICD-10-CM | POA: Diagnosis not present

## 2022-03-24 DIAGNOSIS — H109 Unspecified conjunctivitis: Secondary | ICD-10-CM

## 2022-03-24 MED ORDER — PREDNISONE 20 MG PO TABS: ORAL_TABLET | ORAL | 0 refills | Status: DC

## 2022-03-24 MED ORDER — MOXIFLOXACIN HCL 0.5 % OP SOLN: 1.0000 [drp] | Freq: Three times a day (TID) | OPHTHALMIC | 0 refills | Status: AC

## 2022-03-24 NOTE — ED Provider Notes (Signed)
[name]    CSN: [id number] Arrival date & time: 03/24/22  0907      History   Chief Complaint Chief Complaint  Patient presents with   Conjunctivitis    HPI Karen Stokes is a 36 y.o. female.   HPI 36 year old female presents with eye swelling and drainage that began yesterday.  PMH significant for morbid obesity, uncontrolled T2DM with hyperglycemia, and primary HTN.  Past Medical History:  Diagnosis Date   Allergy    Anxiety    Depression    Eosinophilic esophagitis    GERD (gastroesophageal reflux disease)    Hypertension    Irritable bowel syndrome    Obesity     Patient Active Problem List   Diagnosis Date Noted   Environmental allergies 12/10/2021   Mood disorder (HCC) 08/12/2021   Left lumbar radiculitis 10/22/2020   Stress at home 03/29/2020   Moderate episode of recurrent major depressive disorder (HCC) 12/27/2019   Scalp itch 09/27/2019   Seasonal allergies 08/01/2019   Allergic conjunctivitis and rhinitis, bilateral 08/01/2019   Strain of rhomboid muscle 03/28/2019   History of shingles 12/21/2018   Hyperlipidemia associated with type 2 diabetes mellitus (HCC) 05/09/2018   Primary hypertension 05/09/2018   Compulsive behaviors 05/09/2018   Diabetic eye exam (HCC) 05/09/2018   Intractable migraine without aura and without status migrainosus 03/17/2018   Encounter for routine checking of intrauterine contraceptive device (IUD) 03/17/2018   Uncontrolled type 2 diabetes mellitus with hyperglycemia (HCC) 02/13/2018   Palpitations 02/01/2018   Class 3 severe obesity due to excess calories with serious comorbidity and body mass index (BMI) of 50.0 to 59.9 in adult (HCC) 02/01/2018   Uncontrolled stage 2 hypertension 02/01/2018   Acute right-sided low back pain without sciatica 02/01/2018   Dyspnea on exertion 02/01/2018   GAD (generalized anxiety disorder) 02/01/2018   Severe episode of recurrent major depressive disorder, without  psychotic features (HCC) 02/01/2018   Eosinophilic esophagitis    Irritable bowel syndrome     Past Surgical History:  Procedure Laterality Date   CHOLECYSTECTOMY     ESOPHAGEAL DILATION     TONSILLECTOMY      OB History     Gravida  3   Para  1   Term      Preterm      AB  2   Living  1      SAB      IAB  2   Ectopic      Multiple      Live Births               Home Medications    Prior to Admission medications   Medication Sig Start Date End Date Taking? Authorizing Provider  moxifloxacin (VIGAMOX) 0.5 % ophthalmic solution Place 1 drop into the right eye 3 (three) times daily for 7 days. 03/24/22 03/31/22 Yes Trevor Iha, FNP  predniSONE (DELTASONE) 20 MG tablet Take 3 tabs PO daily x 5 days. 03/24/22  Yes Trevor Iha, FNP  atorvastatin (LIPITOR) 80 MG tablet Take 1 tablet (80 mg total) by mouth daily. 12/31/21   Breeback, Jade L, PA-C  azelastine (OPTIVAR) 0.05 % ophthalmic solution Place 1 drop into both eyes 2 (two) times daily. 08/01/19   Breeback, Jade L, PA-C  buPROPion (WELLBUTRIN) 100 MG tablet Take 1 tablet (100 mg total) by mouth 2 (two) times daily. 12/10/21   Breeback, Jade L, PA-C  clonazePAM (KLONOPIN) 0.5 MG tablet TAKE 1 TABLET BY MOUTH  TWICE A DAY AS NEEDED FOR ANXIETY 03/03/22   Breeback, Jade L, PA-C  dapagliflozin propanediol (FARXIGA) 10 MG TABS tablet Take 1 tablet (10 mg total) by mouth daily. 12/10/21   Breeback, Jade L, PA-C  diphenhydrAMINE (BENADRYL) 25 MG tablet Take 25 mg by mouth at bedtime.    [provider]  ezetimibe (ZETIA) 10 MG tablet Take 1 tablet (10 mg total) by mouth daily. 01/02/22   Breeback, Jade L, PA-C  FLUoxetine (PROZAC) 40 MG capsule TAKE 1 CAPSULE BY MOUTH DAILY 10/31/21   Breeback, Jade L, PA-C  gabapentin (NEURONTIN) 300 MG capsule Take 1 capsule (300 mg total) by mouth 3 (three) times daily. 08/05/21   Breeback, Jade L, PA-C  glipiZIDE (GLUCOTROL) 10 MG tablet TAKE 1 TABLET (10 MG TOTAL) BY  MOUTH TWICE A DAY BEFORE A MEAL 03/16/22   Breeback, Jade L, PA-C  ibuprofen (ADVIL) 800 MG tablet Take 1 tablet (800 mg total) by mouth every 8 (eight) hours as needed. 05/21/20   Breeback, Lonna Cobb, PA-C  lamoTRIgine (LAMICTAL) 25 MG tablet Take two tablets daily. 12/10/21   Breeback, Jade L, PA-C  loratadine (CLARITIN) 10 MG tablet Take 20 mg by mouth daily.    [provider]  montelukast (SINGULAIR) 10 MG tablet TAKE 1 TABLET BY MOUTH EVERYDAY AT BEDTIME 12/10/21   Breeback, Jade L, PA-C  omeprazole (PRILOSEC) 40 MG capsule     [provider]  pioglitazone (ACTOS) 15 MG tablet TAKE 1 TABLET (15 MG TOTAL) BY MOUTH DAILY. NEEDS APPT 02/09/22   Tandy Gaw L, PA-C  saline (AYR) GEL Place 1 application into both nostrils.    [provider]  sitaGLIPtin (JANUVIA) 100 MG tablet Take 1 tablet (100 mg total) by mouth daily. 12/10/21   Breeback, Jade L, PA-C  valsartan-hydrochlorothiazide (DIOVAN-HCT) 320-25 MG tablet TAKE 1 TABLET BY MOUTH EVERY DAY 02/10/22   Jomarie Longs, PA-C    Family History Family History  Problem Relation Age of Onset   Irritable bowel syndrome Mother    Anxiety disorder Mother    Depression Mother    Cirrhosis Father    Hypertension Maternal Grandfather    Heart attack Maternal Grandfather    Stroke Maternal Grandfather     Social History Social History   Tobacco Use   Smoking status: Never   Smokeless tobacco: Never  Vaping Use   Vaping Use: Never used  Substance Use Topics   Alcohol use: Never   Drug use: Never     Allergies   Amoxicillin-pot clavulanate, Metformin and related, Morphine and related, and Trileptal [oxcarbazepine]   Review of Systems Review of Systems   Physical Exam Triage Vital Signs ED Triage Vitals  Enc Vitals Group     BP 03/24/22 0954 (!) 141/91     Pulse Rate 03/24/22 0954 96     Resp 03/24/22 0954 14     Temp 03/24/22 0954 99 F (37.2 C)     Temp Source 03/24/22 0954 Oral     SpO2  03/24/22 0954 96 %     Weight --      Height --      Head Circumference --      Peak Flow --      Pain Score 03/24/22 0955 3     Pain Loc --      Pain Edu? --      Excl. in GC? --    No data found.  Updated Vital Signs BP (!) 141/91 (  BP Location: Right Arm)   Pulse 96   Temp 99 F (37.2 C) (Oral)   Resp 14   SpO2 96%      Physical Exam Vitals and nursing note reviewed.  Constitutional:      General: She is not in acute distress.    Appearance: Normal appearance. She is obese. She is ill-appearing.  HENT:     Head: Normocephalic and atraumatic.     Mouth/Throat:     Mouth: Mucous membranes are moist.     Pharynx: Oropharynx is clear.  Eyes:     Extraocular Movements: Extraocular movements intact.     Conjunctiva/sclera: Conjunctivae normal.     Pupils: Pupils are equal, round, and reactive to light.     Comments: Right eye: sclera with +3 injection and moderate swelling of right eye lid  Cardiovascular:     Rate and Rhythm: Normal rate and regular rhythm.     Pulses: Normal pulses.     Heart sounds: Normal heart sounds. No murmur heard. Pulmonary:     Effort: Pulmonary effort is normal.     Breath sounds: Normal breath sounds. No wheezing, rhonchi or rales.  Musculoskeletal:        General: Normal range of motion.     Cervical back: Normal range of motion and neck supple. No tenderness.  Lymphadenopathy:     Cervical: No cervical adenopathy.  Skin:    General: Skin is warm and dry.  Neurological:     General: No focal deficit present.     Mental Status: She is alert and oriented to person, place, and time.      UC Treatments / Results  Labs (all labs ordered are listed, but only abnormal results are displayed) Labs Reviewed - No data to display  EKG   Radiology No results found.  Procedures Procedures (including critical care time)  Medications Ordered in UC Medications - No data to display  Initial Impression / Assessment and Plan / UC Course   I have reviewed the triage vital signs and the nursing notes.  Pertinent labs & imaging results that were available during my care of the patient were reviewed by me and considered in my medical decision making (see chart for details).     MDM: 1.  Conjunctivitis of right eye, unspecified conjunctivitis type-Rx'd Vigamox; 2.  Swelling of right upper eyelid-Rx'd prednisone. Advised patient to take medication as directed with food to completion.  Advised patient if left eye develops similar symptoms may treat left eye with Vigamox as well.  Advised if symptoms worsen and/or unresolved please follow-up with your optometrist/ophthalmologist for further evaluation.  Discharged home, hemodynamically stable. Final Clinical Impressions(s) / UC Diagnoses   Final diagnoses:  Conjunctivitis of right eye, unspecified conjunctivitis type  Swelling of right upper eyelid   Discharge Instructions   None     ED Prescriptions     Medication Sig Dispense Auth. Provider   predniSONE (DELTASONE) 20 MG tablet Take 3 tabs PO daily x 5 days. 15 tablet Trevor Iha, FNP   moxifloxacin (VIGAMOX) 0.5 % ophthalmic solution Place 1 drop into the right eye 3 (three) times daily for 7 days. 3 mL Trevor Iha, FNP      PDMP not reviewed this encounter.   Trevor Iha, FNP 03/24/22 1144

## 2022-03-24 NOTE — ED Triage Notes (Signed)
Pt presents with eye swelling and drainage that began yesterday

## 2022-03-24 NOTE — Discharge Instructions (Addendum)
Advised patient to take medication as directed with food to completion.  Advised patient if left eye develops similar symptoms may treat left eye with Vigamox as well.  Advised if symptoms worsen and/or unresolved please follow-up with your optometrist/ophthalmologist for further evaluation.

## 2022-03-25 ENCOUNTER — Other Ambulatory Visit: Payer: Self-pay | Admitting: Physician Assistant

## 2022-03-25 DIAGNOSIS — E1165 Type 2 diabetes mellitus with hyperglycemia: Secondary | ICD-10-CM

## 2022-05-11 ENCOUNTER — Other Ambulatory Visit: Payer: Self-pay | Admitting: Physician Assistant

## 2022-05-11 DIAGNOSIS — I10 Essential (primary) hypertension: Secondary | ICD-10-CM

## 2022-05-28 ENCOUNTER — Other Ambulatory Visit: Payer: Self-pay | Admitting: Physician Assistant

## 2022-05-28 DIAGNOSIS — E1165 Type 2 diabetes mellitus with hyperglycemia: Secondary | ICD-10-CM

## 2022-06-02 ENCOUNTER — Encounter: Payer: Self-pay | Admitting: Physician Assistant

## 2022-06-02 ENCOUNTER — Ambulatory Visit: Payer: BC Managed Care – PPO | Admitting: Physician Assistant

## 2022-06-02 VITALS — BP 118/64 | HR 88 | Ht 64.0 in | Wt 369.1 lb

## 2022-06-02 DIAGNOSIS — E785 Hyperlipidemia, unspecified: Secondary | ICD-10-CM

## 2022-06-02 DIAGNOSIS — E1165 Type 2 diabetes mellitus with hyperglycemia: Secondary | ICD-10-CM

## 2022-06-02 DIAGNOSIS — I10 Essential (primary) hypertension: Secondary | ICD-10-CM

## 2022-06-02 DIAGNOSIS — M26623 Arthralgia of bilateral temporomandibular joint: Secondary | ICD-10-CM | POA: Diagnosis not present

## 2022-06-02 DIAGNOSIS — Z6841 Body Mass Index (BMI) 40.0 and over, adult: Secondary | ICD-10-CM

## 2022-06-02 DIAGNOSIS — E1169 Type 2 diabetes mellitus with other specified complication: Secondary | ICD-10-CM | POA: Diagnosis not present

## 2022-06-02 DIAGNOSIS — H7393 Unspecified disorder of tympanic membrane, bilateral: Secondary | ICD-10-CM | POA: Insufficient documentation

## 2022-06-02 LAB — POCT GLYCOSYLATED HEMOGLOBIN (HGB A1C): Hemoglobin A1C: 6.9 % — AB (ref 4.0–5.6)

## 2022-06-02 MED ORDER — BACLOFEN 10 MG PO TABS: ORAL_TABLET | ORAL | 0 refills | Status: DC

## 2022-06-02 MED ORDER — PIOGLITAZONE HCL 15 MG PO TABS: ORAL_TABLET | ORAL | 0 refills | Status: DC

## 2022-06-02 MED ORDER — DAPAGLIFLOZIN PROPANEDIOL 10 MG PO TABS: 10.0000 mg | ORAL_TABLET | Freq: Every day | ORAL | 0 refills | Status: DC

## 2022-06-02 MED ORDER — MOUNJARO 2.5 MG/0.5ML ~~LOC~~ SOAJ: 2.5000 mg | SUBCUTANEOUS | 0 refills | Status: DC

## 2022-06-02 MED ORDER — VALSARTAN-HYDROCHLOROTHIAZIDE 320-25 MG PO TABS: 1.0000 | ORAL_TABLET | Freq: Every day | ORAL | 1 refills | Status: DC

## 2022-06-02 MED ORDER — GLIPIZIDE 10 MG PO TABS: ORAL_TABLET | ORAL | 0 refills | Status: DC

## 2022-06-02 NOTE — Patient Instructions (Addendum)
Stop [place] and glipizide and start mounjaro and farxiga.  Use voltaren gel over joint as needed.   Temporomandibular Joint Syndrome  Temporomandibular joint syndrome (TMJ syndrome) is a condition that causes pain in the temporomandibular joints. These joints are located near your ears and allow your jaw to open and close. For people with TMJ syndrome, chewing, biting, or other movements of the jaw can be difficult or painful. TMJ syndrome is often mild and goes away within a few weeks. However, sometimes the condition becomes a long-term (chronic) problem. What are the causes? This condition may be caused by: Grinding your teeth or clenching your jaw. Some people do this when they are stressed. Arthritis. An injury to the jaw. A head or neck injury. Teeth or dentures that are not aligned well. In some cases, the cause of TMJ syndrome may not be known. What are the signs or symptoms? The most common symptom of this condition is aching pain on the side of the head in the area of the TMJ. Other symptoms may include: Pain when moving your jaw, such as when chewing or biting. Not being able to open your jaw all the way. Making a clicking sound when you open your mouth. Headache. Earache. Neck or shoulder pain. How is this diagnosed? This condition may be diagnosed based on: Your symptoms and medical history. A physical exam. Your health care provider may check the range of motion of your jaw. Imaging tests, such as X-rays or an MRI. You may also need to see your dentist, who will check if your teeth and jaw are lined up correctly. How is this treated? TMJ syndrome often goes away on its own. If treatment is needed, it may include: Eating soft foods and applying ice or heat. Medicines to relieve pain or inflammation. Medicines or massage to relax the muscles. A splint, bite plate, or mouthpiece to prevent teeth grinding or jaw clenching. Relaxation techniques or counseling to help  reduce stress. A therapy for pain in which an electrical current is applied to the nerves through the skin (transcutaneous electrical nerve stimulation). Acupuncture. This may help to relieve pain. Jaw surgery. This is rarely needed. Follow these instructions at home:  Eating and drinking Eat a soft diet if you are having trouble chewing. Avoid foods that require a lot of chewing. Do not chew gum. General instructions Take over-the-counter and prescription medicines only as told by your health care provider. If directed, put ice on the painful area. To do this: Put ice in a plastic bag. Place a towel between your skin and the bag. Leave the ice on for 20 minutes, 2-3 times a day. Remove the ice if your skin turns bright red. This is very important. If you cannot feel pain, heat, or cold, you have a greater risk of damage to the area. Apply a warm, wet cloth (warm compress) to the painful area as told. Massage your jaw area and do any jaw stretching exercises as told by your health care provider. If you were given a splint, bite plate, or mouthpiece, wear it as told by your health care provider. Keep all follow-up visits. This is important. Where to find more information [place]: https://avila-olson.com/ Contact a health care provider if: You have trouble eating. You have new or worsening symptoms. Get help right away if: Your jaw locks. Summary Temporomandibular joint syndrome (TMJ syndrome) is a condition that causes pain in the temporomandibular joints. These joints are located near  your ears and allow your jaw to open and close. TMJ syndrome is often mild and goes away within a few weeks. However, sometimes the condition becomes a long-term (chronic) problem. Symptoms include an aching pain on the side of the head in the area of the TMJ, pain when chewing or biting, and being unable to open your jaw all the way. You may also make a clicking  sound when you open your mouth. TMJ syndrome often goes away on its own. If treatment is needed, it may include medicines to relieve pain, reduce inflammation, or relax the muscles. A splint, bite plate, or mouthpiece may also be used to prevent teeth grinding or jaw clenching. This information is not intended to replace advice given to you by your health care provider. Make sure you discuss any questions you have with your health care provider. Document Revised: 11/10/2020 Document Reviewed: 11/10/2020 Elsevier Patient Education  [place].

## 2022-06-02 NOTE — Progress Notes (Signed)
Established Patient Office Visit  Subjective   Patient ID: Karen Stokes, female    DOB: April 01, 1986  Age: 37 y.o. MRN: [id number]  Chief Complaint  Patient presents with   Diabetes   Migraine    HPI Pt is a 37 yo obese female with T2DM, HTN, HLD, MDD, GAD who presents to the clinic for follow up.   She has not been taking her [place] and farxiga due to cost. She now has insurance that should pay for it. She is not checking her sugars due to her excessive work.  No hypoglycemic events. She denies any open sores or wounds. She has no CP, palpitations, headaches or vision changes. She has no swelling. She has gained weight since stopping her GLP=1s. She is not exercising.   She does c/o her bilateral jaw hurting. Worse with opening mouth. No fever, chills, sinus pressure. She does have some ear pressure and popping. Not really tried anything for symptoms. Denies any trauma or trigger.   Patient Active Problem List   Diagnosis Date Noted   Tm (tympanic membrane disorder), bilateral 06/02/2022   TMJ tenderness, bilateral 06/02/2022   Environmental allergies 12/10/2021   Mood disorder ([place]) 08/12/2021   Left lumbar radiculitis 10/22/2020   Stress at home 03/29/2020   Moderate episode of recurrent major depressive disorder ([place]) 12/27/2019   Scalp itch 09/27/2019   Seasonal allergies 08/01/2019   Allergic conjunctivitis and rhinitis, bilateral 08/01/2019   Strain of rhomboid muscle 03/28/2019   History of shingles 12/21/2018   Hyperlipidemia associated with type 2 diabetes mellitus ([place]) 05/09/2018   Primary hypertension 05/09/2018   Compulsive behaviors 05/09/2018   Diabetic eye exam ([place]) 05/09/2018   Intractable migraine without aura and without status migrainosus 03/17/2018   Encounter for routine checking of intrauterine contraceptive device (IUD) 03/17/2018   Type 2 diabetes mellitus with hyperglycemia ([place]) 02/13/2018   Palpitations 02/01/2018   Class 3 severe obesity due to  excess calories with serious comorbidity and body mass index (BMI) of 60.0 to 69.9 in adult ([place]) 02/01/2018   Uncontrolled stage 2 hypertension 02/01/2018   Acute right-sided low back pain without sciatica 02/01/2018   Dyspnea on exertion 02/01/2018   GAD (generalized anxiety disorder) 02/01/2018   Severe episode of recurrent major depressive disorder, without psychotic features ([place]) [id number]   Eosinophilic esophagitis    Irritable bowel syndrome    Past Medical History:  Diagnosis Date   Allergy    Anxiety    Depression    Eosinophilic esophagitis    GERD (gastroesophageal reflux disease)    Hypertension    Irritable bowel syndrome    Obesity    Past Surgical History:  Procedure Laterality Date   CHOLECYSTECTOMY     ESOPHAGEAL DILATION     TONSILLECTOMY     Family History  Problem Relation Age of Onset   Irritable bowel syndrome Mother    Anxiety disorder Mother    Depression Mother    Cirrhosis Father    Hypertension Maternal Grandfather    Heart attack Maternal Grandfather    Stroke Maternal Grandfather    Allergies  Allergen Reactions   Amoxicillin-Pot Clavulanate Nausea And Vomiting   Metformin And Related Diarrhea    Severe GI upset   Morphine And Related Other (See Comments)    headaches    Trileptal [Oxcarbazepine] Other (See Comments)    hallucinations      ROS See HPI.    Objective:     BP 118/64 (BP Location: Right  Wrist, Patient Position: Sitting, Cuff Size: Small)   Pulse 88   Ht 5' 4"$  (1.626 m)   Wt (!) 369 lb 1.3 oz (167.4 kg)   SpO2 100%   BMI 63.35 kg/m  BP Readings from Last 3 Encounters:  06/02/22 118/64  03/24/22 (!) 141/91  12/30/21 118/71   Wt Readings from Last 3 Encounters:  06/02/22 (!) 369 lb 1.3 oz (167.4 kg)  12/30/21 (!) 346 lb (156.9 kg)  12/10/21 (!) 357 lb (161.9 kg)    ..    06/02/2022    9:32 AM 12/10/2021    9:16 AM 08/05/2021   10:24 AM 01/01/2021    9:55 AM 03/27/2020    9:13 AM  Depression screen PHQ  2/9  Decreased Interest 1 2 1 2 $ 0  Down, Depressed, Hopeless 1 2 1 $ 0 0  PHQ - 2 Score 2 4 2 2 $ 0  Altered sleeping 1 3 1 3 2  $ Tired, decreased energy 1 3 2 3 1  $ Change in appetite 1 2 2 3 $ 0  Feeling bad or failure about yourself  1 1 0 0 0  Trouble concentrating 1 1 2 $ 0 0  Moving slowly or fidgety/restless 1 0 2 0 0  Suicidal thoughts 1 0 0 0 0  PHQ-9 Score 9 14 11 11 3  $ Difficult doing work/chores Somewhat difficult Extremely dIfficult Somewhat difficult Somewhat difficult Not difficult at all   ..    06/02/2022    9:32 AM 12/10/2021    9:17 AM 08/05/2021   10:24 AM 01/01/2021    9:55 AM  GAD 7 : Generalized Anxiety Score  Nervous, Anxious, on Edge 1 2 2 3  $ Control/stop worrying 1 2 1 2  $ Worry too much - different things 1 2 1 2  $ Trouble relaxing 1 2 2 2  $ Restless 1 1 2 2  $ Easily annoyed or irritable 1 1 1 2  $ Afraid - awful might happen 1 1 0 2  Total GAD 7 Score 7 11 9 15  $ Anxiety Difficulty Somewhat difficult Extremely difficult Somewhat difficult Somewhat difficult    .Marland Kitchen Results for orders placed or performed in visit on 06/02/22  POCT glycosylated hemoglobin (Hb A1C)  Result Value Ref Range   Hemoglobin A1C 6.9 (A) 4.0 - 5.6 %   HbA1c POC (<> result, manual entry)     HbA1c, POC (prediabetic range)     HbA1c, POC (controlled diabetic range)       Physical Exam Constitutional:      General: She is not in acute distress.    Appearance: Normal appearance. She is obese. She is not ill-appearing.  HENT:     Head: Normocephalic.     Comments: Tenderness to palpation over bilateral TMJ Some pain with opening of mouth in jaws    Right Ear: Tympanic membrane, ear canal and external ear normal. There is no impacted cerumen.     Left Ear: Tympanic membrane, ear canal and external ear normal. There is no impacted cerumen.     Nose: Nose normal.     Mouth/Throat:     Pharynx: No oropharyngeal exudate or posterior oropharyngeal erythema.  Eyes:     Conjunctiva/sclera:  Conjunctivae normal.  Cardiovascular:     Rate and Rhythm: Normal rate and regular rhythm.     Pulses: Normal pulses.  Pulmonary:     Effort: Pulmonary effort is normal.  Musculoskeletal:     Cervical back: Neck supple. No tenderness.     Right lower  leg: No edema.     Left lower leg: No edema.  Lymphadenopathy:     Cervical: No cervical adenopathy.  Neurological:     General: No focal deficit present.     Mental Status: She is alert and oriented to person, place, and time.  Psychiatric:        Mood and Affect: Mood normal.        Assessment & Plan:  Marland KitchenMarland KitchenThomasa was seen today for diabetes and migraine.  Diagnoses and all orders for this visit:  Type 2 diabetes mellitus with hyperglycemia (HCC) -     POCT glycosylated hemoglobin (Hb A1C) -     pioglitazone (ACTOS) 15 MG tablet; TAKE 1 TABLET (15 MG TOTAL) BY MOUTH DAILY. -     Discontinue: glipiZIDE (GLUCOTROL) 10 MG tablet; TAKE 1 TABLET (10 MG TOTAL) BY MOUTH TWICE A DAY BEFORE A MEAL -     dapagliflozin propanediol (FARXIGA) 10 MG TABS tablet; Take 1 tablet (10 mg total) by mouth daily. -     tirzepatide [facility]) 2.5 MG/0.5ML Pen; Inject 2.5 mg into the skin once a week.  Primary hypertension -     valsartan-hydrochlorothiazide (DIOVAN-HCT) 320-25 MG tablet; Take 1 tablet by mouth daily.  TMJ tenderness, bilateral -     baclofen (LIORESAL) 10 MG tablet; Take one tablet at bedtime.  Hyperlipidemia associated with type 2 diabetes mellitus (HCC)  Class 3 severe obesity due to excess calories with serious comorbidity and body mass index (BMI) of 60.0 to 69.9 in adult (HCC)   A1C 6.9 today and under 7  Restart mounjaro and farxiga Discussed side effects Stop januvia/glipizide Continue actos On statin BP to goal, refilled diovan HCT Follow up in 3 months  Discussed TMJ tenderness Gave HO for massage and exercises Use voltaren gel as needed Start baclofen at bedtime Encouraged ice or heat which ever feels  better Avoid chewy foods and gum Follow up as needed and if symptoms worsen or persist      Iran Planas, PA-C

## 2022-06-10 ENCOUNTER — Other Ambulatory Visit: Payer: Self-pay | Admitting: Physician Assistant

## 2022-06-10 DIAGNOSIS — E1165 Type 2 diabetes mellitus with hyperglycemia: Secondary | ICD-10-CM

## 2022-06-11 ENCOUNTER — Encounter: Payer: Self-pay | Admitting: Physician Assistant

## 2022-06-12 MED ORDER — OZEMPIC (0.25 OR 0.5 MG/DOSE) 2 MG/3ML ~~LOC~~ SOPN: 0.5000 mg | PEN_INJECTOR | SUBCUTANEOUS | 0 refills | Status: DC

## 2022-06-18 ENCOUNTER — Other Ambulatory Visit: Payer: Self-pay | Admitting: Physician Assistant

## 2022-06-18 DIAGNOSIS — F331 Major depressive disorder, recurrent, moderate: Secondary | ICD-10-CM

## 2022-06-26 ENCOUNTER — Other Ambulatory Visit: Payer: Self-pay | Admitting: Neurology

## 2022-06-26 DIAGNOSIS — F331 Major depressive disorder, recurrent, moderate: Secondary | ICD-10-CM

## 2022-06-26 DIAGNOSIS — F39 Unspecified mood [affective] disorder: Secondary | ICD-10-CM

## 2022-06-26 MED ORDER — LAMOTRIGINE 25 MG PO TABS: ORAL_TABLET | ORAL | 1 refills | Status: DC

## 2022-07-27 ENCOUNTER — Other Ambulatory Visit: Payer: Self-pay | Admitting: Physician Assistant

## 2022-07-27 DIAGNOSIS — E1165 Type 2 diabetes mellitus with hyperglycemia: Secondary | ICD-10-CM

## 2022-07-31 ENCOUNTER — Other Ambulatory Visit: Payer: Self-pay | Admitting: Physician Assistant

## 2022-08-03 ENCOUNTER — Other Ambulatory Visit: Payer: Self-pay | Admitting: Physician Assistant

## 2022-08-03 DIAGNOSIS — M5416 Radiculopathy, lumbar region: Secondary | ICD-10-CM

## 2022-08-08 ENCOUNTER — Other Ambulatory Visit: Payer: Self-pay | Admitting: Physician Assistant

## 2022-08-08 DIAGNOSIS — E1165 Type 2 diabetes mellitus with hyperglycemia: Secondary | ICD-10-CM

## 2022-08-19 ENCOUNTER — Other Ambulatory Visit: Payer: Self-pay | Admitting: Physician Assistant

## 2022-08-31 ENCOUNTER — Ambulatory Visit: Payer: BC Managed Care – PPO | Admitting: Physician Assistant

## 2022-09-02 ENCOUNTER — Ambulatory Visit: Payer: BC Managed Care – PPO | Admitting: Physician Assistant

## 2022-09-04 ENCOUNTER — Encounter: Payer: Self-pay | Admitting: Emergency Medicine

## 2022-09-04 ENCOUNTER — Ambulatory Visit
Admission: EM | Admit: 2022-09-04 | Discharge: 2022-09-04 | Disposition: A | Discharge status: Home / Self Care | Payer: BC Managed Care – PPO | Attending: Family Medicine | Admitting: Family Medicine

## 2022-09-04 DIAGNOSIS — R519 Headache, unspecified: Secondary | ICD-10-CM

## 2022-09-04 MED ORDER — KETOROLAC TROMETHAMINE 60 MG/2ML IM SOLN
60.0000 mg | Freq: Once | INTRAMUSCULAR | Status: AC
  Administered 2022-09-04: 60 mg via INTRAMUSCULAR

## 2022-09-04 MED ORDER — SUMATRIPTAN SUCCINATE 100 MG PO TABS: 100.0000 mg | ORAL_TABLET | ORAL | 0 refills | Status: DC | PRN

## 2022-09-04 MED ORDER — METOCLOPRAMIDE HCL 5 MG/ML IJ SOLN
10.0000 mg | INTRAMUSCULAR | Status: AC
  Administered 2022-09-04: 10 mg via INTRAMUSCULAR

## 2022-09-04 NOTE — Discharge Instructions (Addendum)
Advised patient may take Imitrex daily, as needed for headache.  Encouraged increase daily water intake to 64 ounces per day while taking this medication.  Advised if symptoms worsen and/or unresolved please follow-up with PCP or here for further evaluation.

## 2022-09-04 NOTE — ED Provider Notes (Addendum)
[name]    CSN: [id number] Arrival date & time: 09/04/22  1342      History   Chief Complaint Chief Complaint  Patient presents with   Headache    HPI Karen Stokes is a 37 y.o. female.   HPI 37 year old female presents with headache for 2 days.  Reports elevated blood pressure during headache.  Reports BP of 187/92 at 12:30 PM.  PMH significant for morbid obesity, HTN, and anxiety.  Past Medical History:  Diagnosis Date   Allergy    Anxiety    Depression    Eosinophilic esophagitis    GERD (gastroesophageal reflux disease)    Hypertension    Irritable bowel syndrome    Obesity     Patient Active Problem List   Diagnosis Date Noted   Tm (tympanic membrane disorder), bilateral 06/02/2022   TMJ tenderness, bilateral 06/02/2022   Environmental allergies 12/10/2021   Mood disorder (HCC) 08/12/2021   Left lumbar radiculitis 10/22/2020   Stress at home 03/29/2020   Moderate episode of recurrent major depressive disorder (HCC) 12/27/2019   Scalp itch 09/27/2019   Seasonal allergies 08/01/2019   Allergic conjunctivitis and rhinitis, bilateral 08/01/2019   Strain of rhomboid muscle 03/28/2019   History of shingles 12/21/2018   Hyperlipidemia associated with type 2 diabetes mellitus (HCC) 05/09/2018   Primary hypertension 05/09/2018   Compulsive behaviors 05/09/2018   Diabetic eye exam (HCC) 05/09/2018   Intractable migraine without aura and without status migrainosus 03/17/2018   Encounter for routine checking of intrauterine contraceptive device (IUD) 03/17/2018   Type 2 diabetes mellitus with hyperglycemia (HCC) 02/13/2018   Palpitations 02/01/2018   Class 3 severe obesity due to excess calories with serious comorbidity and body mass index (BMI) of 60.0 to 69.9 in adult (HCC) 02/01/2018   Uncontrolled stage 2 hypertension 02/01/2018   Acute right-sided low back pain without sciatica 02/01/2018   Dyspnea on exertion 02/01/2018   GAD (generalized  anxiety disorder) 02/01/2018   Severe episode of recurrent major depressive disorder, without psychotic features (HCC) 02/01/2018   Eosinophilic esophagitis    Irritable bowel syndrome     Past Surgical History:  Procedure Laterality Date   CHOLECYSTECTOMY     ESOPHAGEAL DILATION     TONSILLECTOMY      OB History     Gravida  3   Para  1   Term      Preterm      AB  2   Living  1      SAB      IAB  2   Ectopic      Multiple      Live Births               Home Medications    Prior to Admission medications   Medication Sig Start Date End Date Taking? Authorizing Provider  atorvastatin (LIPITOR) 80 MG tablet Take 1 tablet (80 mg total) by mouth daily. 12/31/21  Yes Breeback, Jade L, PA-C  buPROPion (WELLBUTRIN) 100 MG tablet TAKE 1 TABLET BY MOUTH TWICE A DAY 06/18/22  Yes Breeback, Jade L, PA-C  clonazePAM (KLONOPIN) 0.5 MG tablet TAKE 1 TABLET BY MOUTH TWICE A DAY AS NEEDED FOR ANXIETY 03/03/22  Yes Breeback, Jade L, PA-C  diphenhydrAMINE (BENADRYL) 25 MG tablet Take 25 mg by mouth at bedtime.   Yes [provider]  ezetimibe (ZETIA) 10 MG tablet Take 1 tablet (10 mg total) by mouth daily. 01/02/22  Yes Breeback, Jade L,  PA-C  FARXIGA 10 MG TABS tablet TAKE 1 TABLET BY MOUTH DAILY 08/12/22  Yes Breeback, Jade L, PA-C  FLUoxetine (PROZAC) 40 MG capsule TAKE 1 CAPSULE BY MOUTH DAILY 10/31/21  Yes Breeback, Jade L, PA-C  gabapentin (NEURONTIN) 300 MG capsule TAKE 1 CAPSULE BY MOUTH THREE TIMES A DAY 08/04/22  Yes Breeback, Jade L, PA-C  ibuprofen (ADVIL) 800 MG tablet Take 1 tablet (800 mg total) by mouth every 8 (eight) hours as needed. 05/21/20  Yes Breeback, Jade L, PA-C  lamoTRIgine (LAMICTAL) 25 MG tablet Take two tablets daily. 06/26/22  Yes Breeback, Jade L, PA-C  montelukast (SINGULAIR) 10 MG tablet TAKE 1 TABLET BY MOUTH EVERYDAY AT BEDTIME 12/10/21  Yes Breeback, Jade L, PA-C  OZEMPIC, 0.25 OR 0.5 MG/DOSE, 2 MG/3ML SOPN INJECT SUBCUTANEOUSLY 0.5 MG   EVERY WEEK 08/20/22  Yes Breeback, Jade L, PA-C  SUMAtriptan (IMITREX) 100 MG tablet Take 1 tablet (100 mg total) by mouth every 2 (two) hours as needed for migraine. May repeat in 2 hours if headache persists or recurs. 09/04/22  Yes Trevor Iha, FNP  valsartan-hydrochlorothiazide (DIOVAN-HCT) 320-25 MG tablet Take 1 tablet by mouth daily. 06/02/22  Yes Breeback, Jade L, PA-C  azelastine (OPTIVAR) 0.05 % ophthalmic solution Place 1 drop into both eyes 2 (two) times daily. 08/01/19   Breeback, Jade L, PA-C  baclofen (LIORESAL) 10 MG tablet Take one tablet at bedtime. 06/02/22   Breeback, Jade L, PA-C  loratadine (CLARITIN) 10 MG tablet Take 20 mg by mouth daily.    [provider]  omeprazole (PRILOSEC) 40 MG capsule     [provider]  pioglitazone (ACTOS) 15 MG tablet TAKE 1 TABLET (15 MG TOTAL) BY MOUTH DAILY. 07/27/22   Breeback, Jade L, PA-C  saline (AYR) GEL Place 1 application into both nostrils.    [provider]    Family History Family History  Problem Relation Age of Onset   Irritable bowel syndrome Mother    Anxiety disorder Mother    Depression Mother    Cirrhosis Father    Hypertension Maternal Grandfather    Heart attack Maternal Grandfather    Stroke Maternal Grandfather     Social History Social History   Tobacco Use   Smoking status: Never   Smokeless tobacco: Never  Vaping Use   Vaping Use: Never used  Substance Use Topics   Alcohol use: Never   Drug use: Never     Allergies   Amoxicillin-pot clavulanate, Metformin and related, Morphine and codeine, and Trileptal [oxcarbazepine]   Review of Systems Review of Systems  Neurological:  Positive for headaches.  All other systems reviewed and are negative.    Physical Exam Triage Vital Signs ED Triage Vitals  Enc Vitals Group     BP 09/04/22 1407 127/74     Pulse Rate 09/04/22 1407 96     Resp 09/04/22 1407 16     Temp 09/04/22 1407 98.7 F (37.1 C)     Temp Source  09/04/22 1407 Oral     SpO2 09/04/22 1407 96 %     Weight --      Height 09/04/22 1409 5\' 5"  (1.651 m)     Head Circumference --      Peak Flow --      Pain Score 09/04/22 1409 8     Pain Loc --      Pain Edu? --      Excl. in GC? --    No data found.  Updated Vital Signs BP 127/74 (BP Location: Right Arm)   Pulse 96   Temp 98.7 F (37.1 C) (Oral)   Resp 16   Ht 5\' 5"  (1.651 m)   LMP 08/21/2022   SpO2 96%   BMI 61.42 kg/m    Physical Exam Vitals and nursing note reviewed.  Constitutional:      Appearance: Normal appearance. She is normal weight.  HENT:     Head: Normocephalic and atraumatic.     Right Ear: Tympanic membrane, ear canal and external ear normal.     Left Ear: Tympanic membrane, ear canal and external ear normal.     Mouth/Throat:     Mouth: Mucous membranes are moist.     Pharynx: Oropharynx is clear.  Eyes:     Extraocular Movements: Extraocular movements intact.     Conjunctiva/sclera: Conjunctivae normal.     Pupils: Pupils are equal, round, and reactive to light.  Cardiovascular:     Rate and Rhythm: Normal rate and regular rhythm.     Pulses: Normal pulses.     Heart sounds: Normal heart sounds.  Pulmonary:     Effort: Pulmonary effort is normal.     Breath sounds: Normal breath sounds. No wheezing, rhonchi or rales.  Musculoskeletal:        General: Normal range of motion.     Cervical back: Normal range of motion and neck supple.  Skin:    General: Skin is warm and dry.  Neurological:     General: No focal deficit present.     Mental Status: She is alert and oriented to person, place, and time. Mental status is at baseline.  Psychiatric:        Mood and Affect: Mood normal.        Behavior: Behavior normal.        Thought Content: Thought content normal.      UC Treatments / Results  Labs (all labs ordered are listed, but only abnormal results are displayed) Labs Reviewed - No data to display  EKG   Radiology No results  found.  Procedures Procedures (including critical care time)  Medications Ordered in UC Medications  ketorolac (TORADOL) injection 60 mg (60 mg Intramuscular Given 09/04/22 1519)  metoCLOPramide (REGLAN) injection 10 mg (10 mg Intramuscular Given 09/04/22 1516)    Initial Impression / Assessment and Plan / UC Course  I have reviewed the triage vital signs and the nursing notes.  Pertinent labs & imaging results that were available during my care of the patient were reviewed by me and considered in my medical decision making (see chart for details).     MDM: 1.  Headache disorder-IM Toradol 60 mg given once in clinic, IM Reglan 10 mg given once in clinic, Rx'd Imitrex 100 mg: Take 1 tablet every 2 hours as needed for migraine. Advised patient may take Imitrex daily, as needed for headache.  Encouraged increase daily water intake to 64 ounces per day while taking this medication.  Advised if symptoms worsen and/or unresolved please follow-up with PCP or here for further evaluation.  Work note provided to patient prior to discharge.  Patient discharged home, hemodynamically stable.  Final Clinical Impressions(s) / UC Diagnoses   Final diagnoses:  Headache disorder     Discharge Instructions      Advised patient may take Imitrex daily, as needed for headache.  Encouraged increase daily water intake to 64 ounces per day while taking this medication.  Advised if symptoms worsen and/or  unresolved please follow-up with PCP or here for further evaluation.     ED Prescriptions     Medication Sig Dispense Auth. Provider   SUMAtriptan (IMITREX) 100 MG tablet Take 1 tablet (100 mg total) by mouth every 2 (two) hours as needed for migraine. May repeat in 2 hours if headache persists or recurs. 10 tablet Trevor Iha, FNP      PDMP not reviewed this encounter.   Trevor Iha, FNP 09/04/22 1525    Trevor Iha, FNP 09/04/22 1530

## 2022-09-04 NOTE — ED Triage Notes (Signed)
Patient c/o headache x 2 days then her BP began elevating yesterday and today.  Skin feels like it's on fire.  Some nausea.  Patient is currently on BP meds for HTN.  BP 187/92 @ 12:30pm today.

## 2022-09-06 ENCOUNTER — Other Ambulatory Visit: Payer: Self-pay | Admitting: Physician Assistant

## 2022-09-06 DIAGNOSIS — E1165 Type 2 diabetes mellitus with hyperglycemia: Secondary | ICD-10-CM

## 2022-09-16 ENCOUNTER — Ambulatory Visit: Payer: BC Managed Care – PPO | Admitting: Physician Assistant

## 2022-09-21 ENCOUNTER — Ambulatory Visit: Payer: BC Managed Care – PPO | Admitting: Physician Assistant

## 2022-09-21 ENCOUNTER — Encounter: Payer: Self-pay | Admitting: Physician Assistant

## 2022-09-21 VITALS — BP 109/87 | HR 89 | Ht 64.5 in | Wt 344.1 lb

## 2022-09-21 DIAGNOSIS — Z7984 Long term (current) use of oral hypoglycemic drugs: Secondary | ICD-10-CM

## 2022-09-21 DIAGNOSIS — Z794 Long term (current) use of insulin: Secondary | ICD-10-CM | POA: Diagnosis not present

## 2022-09-21 DIAGNOSIS — E1165 Type 2 diabetes mellitus with hyperglycemia: Secondary | ICD-10-CM

## 2022-09-21 DIAGNOSIS — Z3009 Encounter for other general counseling and advice on contraception: Secondary | ICD-10-CM

## 2022-09-21 LAB — POCT GLYCOSYLATED HEMOGLOBIN (HGB A1C): HbA1c POC (<> result, manual entry): 6.7 % (ref 4.0–5.6)

## 2022-09-21 NOTE — Progress Notes (Unsigned)
Established Patient Office Visit  Subjective   Patient ID: Karen Stokes, female    DOB: [birth year]  Age: 37 y.o. MRN: [id number]  Chief Complaint  Patient presents with   Diabetes   discuss risk of having a baby at her age    HPI Pt is a 37 yo obese female with T2DM, HTN, Migraines, HLD who presents to the clinic for 3 month follow up.   She is doing well. She is not checking her sugars. She is compliant with ozempic and farxiga. No open sores or wounds. No hypoglycemic events. Denies any CP, palpitations, headaches or vision changes.   She is interested in discussing getting pregnant. She does have IUD.     Active Ambulatory Problems    Diagnosis Date Noted   Eosinophilic esophagitis    Irritable bowel syndrome    Palpitations 02/01/2018   Class 3 severe obesity due to excess calories with serious comorbidity and body mass index (BMI) of 60.0 to 69.9 in adult (HCC) 02/01/2018   Uncontrolled stage 2 hypertension 02/01/2018   Acute right-sided low back pain without sciatica 02/01/2018   Dyspnea on exertion 02/01/2018   GAD (generalized anxiety disorder) 02/01/2018   Severe episode of recurrent major depressive disorder, without psychotic features (HCC) 02/01/2018   Type 2 diabetes mellitus with hyperglycemia (HCC) 02/13/2018   Intractable migraine without aura and without status migrainosus 03/17/2018   Encounter for routine checking of intrauterine contraceptive device (IUD) 03/17/2018   Hyperlipidemia associated with type 2 diabetes mellitus (HCC) 05/09/2018   Primary hypertension 05/09/2018   Compulsive behaviors 05/09/2018   Diabetic eye exam (HCC) 05/09/2018   History of shingles 12/21/2018   Strain of rhomboid muscle 03/28/2019   Seasonal allergies 08/01/2019   Allergic conjunctivitis and rhinitis, bilateral 08/01/2019   Scalp itch 09/27/2019   Moderate episode of recurrent major depressive disorder (HCC) 12/27/2019   Stress at home 03/29/2020   Left lumbar  radiculitis 10/22/2020   Mood disorder (HCC) 08/12/2021   Environmental allergies 12/10/2021   Tm (tympanic membrane disorder), bilateral 06/02/2022   TMJ tenderness, bilateral 06/02/2022   Resolved Ambulatory Problems    Diagnosis Date Noted   No Resolved Ambulatory Problems   Past Medical History:  Diagnosis Date   Allergy    Anxiety    Depression    GERD (gastroesophageal reflux disease)    Hypertension    Obesity      ROS   See HPI Objective:     BP 109/87   Pulse 89   Ht 5' 4.5" (1.638 m)   Wt (!) 344 lb 2 oz (156.1 kg)   LMP 08/21/2022   SpO2 98%   BMI 58.16 kg/m  BP Readings from Last 3 Encounters:  09/21/22 109/87  09/04/22 127/74  06/02/22 118/64   Wt Readings from Last 3 Encounters:  09/21/22 (!) 344 lb 2 oz (156.1 kg)  06/02/22 (!) 369 lb 1.3 oz (167.4 kg)  12/30/21 (!) 346 lb (156.9 kg)    .Marland Kitchen Results for orders placed or performed in visit on 09/21/22  POCT HgB A1C  Result Value Ref Range   Hemoglobin A1C     HbA1c POC (<> result, manual entry) 6.7 4.0 - 5.6 %   HbA1c, POC (prediabetic range)     HbA1c, POC (controlled diabetic range)       Physical Exam Constitutional:      Appearance: Normal appearance. She is obese.  HENT:     Head: Normocephalic.  Cardiovascular:  Rate and Rhythm: Normal rate and regular rhythm.  Pulmonary:     Effort: Pulmonary effort is normal.     Breath sounds: Normal breath sounds.  Musculoskeletal:     Right lower leg: No edema.     Left lower leg: No edema.  Neurological:     General: No focal deficit present.     Mental Status: She is alert and oriented to person, place, and time.  Psychiatric:        Mood and Affect: Mood normal.        Assessment & Plan:  Marland KitchenMarland KitchenMarlyse was seen today for diabetes and discuss risk of having a baby at her age.  Diagnoses and all orders for this visit:  Type 2 diabetes mellitus with hyperglycemia, without long-term current use of insulin (HCC) -     POCT HgB  A1C  Family planning counseling   A1C to goal Stay on same medication BP to goal Declines statin, on zetia Needs eye exam Vaccines UTD Follow up in 3 months  Discussed risk of getting pregnancy and with her co-morbidities.  Suggest making appt with OB to discuss in more detail.  Discussed she would be high risk and her medications would have to be changed.    Tandy Gaw, PA-C

## 2022-09-22 ENCOUNTER — Encounter: Payer: Self-pay | Admitting: Physician Assistant

## 2022-09-29 ENCOUNTER — Other Ambulatory Visit: Payer: Self-pay | Admitting: Physician Assistant

## 2022-09-29 DIAGNOSIS — F411 Generalized anxiety disorder: Secondary | ICD-10-CM

## 2022-09-29 DIAGNOSIS — F439 Reaction to severe stress, unspecified: Secondary | ICD-10-CM

## 2022-10-04 ENCOUNTER — Other Ambulatory Visit: Payer: Self-pay | Admitting: Physician Assistant

## 2022-10-04 DIAGNOSIS — F332 Major depressive disorder, recurrent severe without psychotic features: Secondary | ICD-10-CM

## 2022-10-04 DIAGNOSIS — F411 Generalized anxiety disorder: Secondary | ICD-10-CM

## 2022-10-06 NOTE — Telephone Encounter (Signed)
Requesting fluoxetine 40mg   Last written 10/22/2021 as #90 one refill Last OV 09/21/2022 Upcoming apt 12/22/2022

## 2022-10-22 ENCOUNTER — Telehealth: Payer: BC Managed Care – PPO | Admitting: Physician Assistant

## 2022-10-22 DIAGNOSIS — J069 Acute upper respiratory infection, unspecified: Secondary | ICD-10-CM

## 2022-10-22 DIAGNOSIS — R112 Nausea with vomiting, unspecified: Secondary | ICD-10-CM | POA: Diagnosis not present

## 2022-10-22 MED ORDER — ONDANSETRON 4 MG PO TBDP
4.0000 mg | ORAL_TABLET | Freq: Three times a day (TID) | ORAL | 0 refills | Status: DC | PRN
Start: 2022-10-22 — End: 2023-12-19

## 2022-10-22 MED ORDER — FLUTICASONE PROPIONATE 50 MCG/ACT NA SUSP
2.0000 | Freq: Every day | NASAL | 0 refills | Status: DC
Start: 2022-10-22 — End: 2022-12-09

## 2022-10-22 MED ORDER — PROMETHAZINE-DM 6.25-15 MG/5ML PO SYRP
5.0000 mL | ORAL_SOLUTION | Freq: Four times a day (QID) | ORAL | 0 refills | Status: DC | PRN
Start: 2022-10-22 — End: 2022-12-09

## 2022-10-22 NOTE — Addendum Note (Signed)
Addended by: [name] on: 10/22/2022 05:15 PM   Modules accepted: Orders

## 2022-10-22 NOTE — Addendum Note (Signed)
Addended by: [name] on: 10/22/2022 05:01 PM   Modules accepted: Level of Service

## 2022-10-22 NOTE — Progress Notes (Signed)
E-Visit for Upper Respiratory Infection   We are sorry you are not feeling well.  Here is how we plan to help!  Based on what you have shared with me, it looks like you may have a viral upper respiratory infection.  Upper respiratory infections are caused by a large number of viruses; however, rhinovirus is the most common cause.   Symptoms vary from person to person, with common symptoms including sore throat, cough, fatigue or lack of energy and feeling of general discomfort.  A low-grade fever of up to 100.4 may present, but is often uncommon.  Symptoms vary however, and are closely related to a person's age or underlying illnesses.  The most common symptoms associated with an upper respiratory infection are nasal discharge or congestion, cough, sneezing, headache and pressure in the ears and face.  These symptoms usually persist for about 3 to 10 days, but can last up to 2 weeks.  It is important to know that upper respiratory infections do not cause serious illness or complications in most cases.    Upper respiratory infections can be transmitted from person to person, with the most common method of transmission being a person's hands.  The virus is able to live on the skin and can infect other persons for up to 2 hours after direct contact.  Also, these can be transmitted when someone coughs or sneezes; thus, it is important to cover the mouth to reduce this risk.  To keep the spread of the illness at bay, good hand hygiene is very important.  This is an infection that is most likely caused by a virus. There are no specific treatments other than to help you with the symptoms until the infection runs its course.  We are sorry you are not feeling well.  Here is how we plan to help!   For nasal congestion, you may use an oral decongestants such as Mucinex D or if you have glaucoma or high blood pressure use plain Mucinex.  Saline nasal spray or nasal drops can help and can safely be used as often as  needed for congestion.  For your congestion, I have prescribed Fluticasone nasal spray one spray in each nostril twice a day  If you do not have a history of heart disease, hypertension, diabetes or thyroid disease, prostate/bladder issues or glaucoma, you may also use Sudafed to treat nasal congestion.  It is highly recommended that you consult with a pharmacist or your primary care physician to ensure this medication is safe for you to take.     If you have a cough, you may use cough suppressants such as Delsym and Robitussin.  If you have glaucoma or high blood pressure, you can also use Coricidin HBP.   For cough I have prescribed for you Promethazine DM cough syrup Take 5mL every 6 hours as needed for cough.   If you have a sore or scratchy throat, use a saltwater gargle-  to  teaspoon of salt dissolved in a 4-ounce to 8-ounce glass of warm water.  Gargle the solution for approximately 15-30 seconds and then spit.  It is important not to swallow the solution.  You can also use throat lozenges/cough drops and Chloraseptic spray to help with throat pain or discomfort.  Warm or cold liquids can also be helpful in relieving throat pain.  For headache, pain or general discomfort, you can use Ibuprofen or Tylenol as directed.   Some authorities believe that zinc sprays or the use of  Echinacea may shorten the course of your symptoms.  I would recommend for you to take an at home Covid 19 test as your symptoms could be consistent with this infection. If you are positive please reach back out as you would be a candidate for antiviral treatment.   HOME CARE Only take medications as instructed by your medical team. Be sure to drink plenty of fluids. Water is fine as well as fruit juices, sodas and electrolyte beverages. You may want to stay away from caffeine or alcohol. If you are nauseated, try taking small sips of liquids. How do you know if you are getting enough fluid? Your urine should be a pale  yellow or almost colorless. Get rest. Taking a steamy shower or using a humidifier may help nasal congestion and ease sore throat pain. You can place a towel over your head and breathe in the steam from hot water coming from a faucet. Using a saline nasal spray works much the same way. Cough drops, hard candies and sore throat lozenges may ease your cough. Avoid close contacts especially the very young and the elderly Cover your mouth if you cough or sneeze Always remember to wash your hands.   GET HELP RIGHT AWAY IF: You develop worsening fever. If your symptoms do not improve within 10 days You develop yellow or green discharge from your nose over 3 days. You have coughing fits You develop a severe head ache or visual changes. You develop shortness of breath, difficulty breathing or start having chest pain Your symptoms persist after you have completed your treatment plan  MAKE SURE YOU  Understand these instructions. Will watch your condition. Will get help right away if you are not doing well or get worse.  Thank you for choosing an e-visit.  Your e-visit answers were reviewed by a board certified advanced clinical practitioner to complete your personal care plan. Depending upon the condition, your plan could have included both over the counter or prescription medications.  Please review your pharmacy choice. Make sure the pharmacy is open so you can pick up prescription now. If there is a problem, you may contact your provider through [employer] and have the prescription routed to another pharmacy.  Your safety is important to [place]. If you have drug allergies check your prescription carefully.   For the next 24 hours you can use MyChart to ask questions about today's visit, request a non-urgent call back, or ask for a work or school excuse. You will get an email in the next two days asking about your experience. I hope that your e-visit has been valuable and will speed your  recovery.   I have spent 5 minutes in review of e-visit questionnaire, review and updating patient chart, medical decision making and response to patient.   [name], PA-C

## 2022-11-01 ENCOUNTER — Other Ambulatory Visit: Payer: Self-pay | Admitting: Physician Assistant

## 2022-11-01 DIAGNOSIS — I10 Essential (primary) hypertension: Secondary | ICD-10-CM

## 2022-11-03 ENCOUNTER — Other Ambulatory Visit: Payer: Self-pay | Admitting: Physician Assistant

## 2022-11-03 DIAGNOSIS — F439 Reaction to severe stress, unspecified: Secondary | ICD-10-CM

## 2022-11-03 DIAGNOSIS — F411 Generalized anxiety disorder: Secondary | ICD-10-CM

## 2022-11-03 DIAGNOSIS — M5416 Radiculopathy, lumbar region: Secondary | ICD-10-CM

## 2022-11-16 ENCOUNTER — Other Ambulatory Visit: Payer: Self-pay | Admitting: Physician Assistant

## 2022-11-16 DIAGNOSIS — I10 Essential (primary) hypertension: Secondary | ICD-10-CM

## 2022-12-08 ENCOUNTER — Other Ambulatory Visit: Payer: Self-pay | Admitting: Physician Assistant

## 2022-12-08 DIAGNOSIS — F331 Major depressive disorder, recurrent, moderate: Secondary | ICD-10-CM

## 2022-12-08 DIAGNOSIS — F39 Unspecified mood [affective] disorder: Secondary | ICD-10-CM

## 2022-12-09 ENCOUNTER — Encounter: Payer: Self-pay | Admitting: Physician Assistant

## 2022-12-09 ENCOUNTER — Ambulatory Visit: Payer: BC Managed Care – PPO | Admitting: Physician Assistant

## 2022-12-09 VITALS — Resp 20 | Ht 64.5 in | Wt 311.1 lb

## 2022-12-09 DIAGNOSIS — R634 Abnormal weight loss: Secondary | ICD-10-CM

## 2022-12-09 DIAGNOSIS — Z79899 Other long term (current) drug therapy: Secondary | ICD-10-CM

## 2022-12-09 DIAGNOSIS — E1165 Type 2 diabetes mellitus with hyperglycemia: Secondary | ICD-10-CM | POA: Diagnosis not present

## 2022-12-09 DIAGNOSIS — T887XXA Unspecified adverse effect of drug or medicament, initial encounter: Secondary | ICD-10-CM

## 2022-12-09 DIAGNOSIS — G90A Postural orthostatic tachycardia syndrome (POTS): Secondary | ICD-10-CM | POA: Diagnosis not present

## 2022-12-09 DIAGNOSIS — Z7984 Long term (current) use of oral hypoglycemic drugs: Secondary | ICD-10-CM

## 2022-12-09 MED ORDER — MOUNJARO 2.5 MG/0.5ML ~~LOC~~ SOAJ: 2.5000 mg | SUBCUTANEOUS | 0 refills | Status: DC

## 2022-12-09 NOTE — Progress Notes (Signed)
Acute Office Visit  Subjective:     Patient ID: Karen Stokes, female    DOB: September 19, 1985, 37 y.o.   MRN: [id number]  Chief Complaint  Patient presents with   Dizziness   Nausea    HPI Patient is in today for dizziness and nausea. She has had dizziness with position changes for the last month but worsening over the past few weeks. She has no desire to eat due to nausea and food intolerance. Worsened with ozempic .5mg  weekly. She did not have this mounjaro. She has lost 35lbs in 3 months. She is drinking some but has no appetite. Her sugars in morning are 110s to 130s.   .. Active Ambulatory Problems    Diagnosis Date Noted   Eosinophilic esophagitis    Irritable bowel syndrome    Palpitations 02/01/2018   Class 3 severe obesity due to excess calories with serious comorbidity and body mass index (BMI) of 60.0 to 69.9 in adult (HCC) 02/01/2018   Uncontrolled stage 2 hypertension 02/01/2018   Acute right-sided low back pain without sciatica 02/01/2018   Dyspnea on exertion 02/01/2018   GAD (generalized anxiety disorder) 02/01/2018   Severe episode of recurrent major depressive disorder, without psychotic features (HCC) 02/01/2018   Type 2 diabetes mellitus with hyperglycemia (HCC) 02/13/2018   Intractable migraine without aura and without status migrainosus 03/17/2018   Encounter for routine checking of intrauterine contraceptive device (IUD) 03/17/2018   Hyperlipidemia associated with type 2 diabetes mellitus (HCC) 05/09/2018   Primary hypertension 05/09/2018   Compulsive behaviors 05/09/2018   Diabetic eye exam (HCC) 05/09/2018   History of shingles 12/21/2018   Strain of rhomboid muscle 03/28/2019   Seasonal allergies 08/01/2019   Allergic conjunctivitis and rhinitis, bilateral 08/01/2019   Scalp itch 09/27/2019   Moderate episode of recurrent major depressive disorder (HCC) 12/27/2019   Stress at home 03/29/2020   Left lumbar radiculitis 10/22/2020   Mood disorder (HCC)  08/12/2021   Environmental allergies 12/10/2021   Tm (tympanic membrane disorder), bilateral 06/02/2022   TMJ tenderness, bilateral 06/02/2022   POTS (postural orthostatic tachycardia syndrome) 12/09/2022   Morbid obesity (HCC) 12/09/2022   Weight loss 12/09/2022   Resolved Ambulatory Problems    Diagnosis Date Noted   No Resolved Ambulatory Problems   Past Medical History:  Diagnosis Date   Allergy    Anxiety    Depression    GERD (gastroesophageal reflux disease)    Hypertension    Obesity      ROS  See HPI.     Objective:    Resp 20   Ht 5' 4.5" (1.638 m)   Wt (!) 311 lb 1.9 oz (141.1 kg)   SpO2 98%   BMI 52.58 kg/m  BP Readings from Last 3 Encounters:  09/21/22 109/87  09/04/22 127/74  06/02/22 118/64   Wt Readings from Last 3 Encounters:  12/09/22 (!) 311 lb 1.9 oz (141.1 kg)  09/21/22 (!) 344 lb 2 oz (156.1 kg)  06/02/22 (!) 369 lb 1.3 oz (167.4 kg)       Physical Exam Constitutional:      Appearance: Normal appearance. She is obese.     Comments: Flushed cheeks  HENT:     Head: Normocephalic.  Cardiovascular:     Rate and Rhythm: Normal rate and regular rhythm.     Pulses: Normal pulses.     Heart sounds: Normal heart sounds.  Pulmonary:     Effort: Pulmonary effort is normal.     Breath  sounds: Normal breath sounds.  Musculoskeletal:     Cervical back: Normal range of motion.     Right lower leg: No edema.     Left lower leg: No edema.  Neurological:     General: No focal deficit present.     Mental Status: She is alert and oriented to person, place, and time.  Psychiatric:        Mood and Affect: Mood normal.          Assessment & Plan:  Marland KitchenMarland KitchenWilhelmenia was seen today for dizziness and nausea.  Diagnoses and all orders for this visit:  POTS (postural orthostatic tachycardia syndrome) -     Lipase -     CBC w/Diff/Platelet -     CMP14+EGFR -     TSH  Medication management -     Lipase -     CBC w/Diff/Platelet -      CMP14+EGFR -     TSH  Type 2 diabetes mellitus with hyperglycemia, without long-term current use of insulin (HCC) -     tirzepatide (MOUNJARO) 2.5 MG/0.5ML Pen; Inject 2.5 mg into the skin once a week.  Weight loss  Morbid obesity (HCC)  Medication side effect   HR raises by greater than 30bpm when standing with orthostatic BP's, BP was stable Discussed POTS, HO given I do think coming from not eating and weight loss Will check cmp/lipase/cbc Would like to switch back to [employer] to pharmacy Declined flu shot today due to symptoms Follow up in 2 weeks Discussed cutting back to ozempic .25mg  until gets mounjaro approved Work on increasing hydration and nutrition even with protein shakes Consider compression stockings    Tandy Gaw, PA-C

## 2022-12-09 NOTE — Patient Instructions (Signed)
Postural Orthostatic Tachycardia Syndrome Postural orthostatic tachycardia syndrome (POTS) is a group of symptoms that occur along with an increase in heart rate when a person stands up after lying down. The symptoms include light-headedness or fainting, and they improve when the person lies back down. POTS may be associated with another medical condition, or it may occur on its own. What are the causes? The cause of this condition is not known, but many conditions and diseases are associated with it. What increases the risk? This condition is more likely to develop in: Women 15-50 years old. Women who are pregnant. Women who are in their period (menstruating). People who have certain conditions, such as: Infection from a virus. Diseases that cause the body's defense system (immune system) to attack healthy organs. These are called autoimmune diseases. Losing a lot of red blood cells (anemia). Losing too much water in the body (dehydration). An overactive thyroid (hyperthyroidism). People who take certain medicines. People who have had a major injury. People who have had surgery. What are the signs or symptoms? The most common symptom of this condition is light-headedness when you stand up from a lying or sitting position. Other symptoms may include: Feeling a rapid increase in the heartbeat (tachycardia) within 10 minutes of standing up. Chest pain. Shortness of breath. Breathing that is deeper and faster than normal (hyperventilation). Fainting. Confusion. Trembling. Weakness. Headache. Anxiety. Nausea. Sweating or flushing. Symptoms may be worse in the morning, and they may be relieved by lying down. How is this diagnosed? This condition is diagnosed based on: Your symptoms. Your medical history. A physical exam. Checking your heart rate when you are lying down and after you stand up. Checking your blood pressure when you go from lying down to standing up. Blood and urine  tests to measure hormones that change with blood pressure. The blood tests will be done when you are lying down and when you are standing up. You may have other tests to check for conditions or diseases that are associated with POTS. How is this treated? Treatment for this condition depends on how severe your symptoms are and whether you have any conditions or diseases that are associated with POTS. Treatment may involve: Treating any conditions or diseases that are associated with POTS. Drinking two glasses of water before getting up from a lying position. Increasing salt (sodium) in your diet. Taking medicine to control blood pressure and heart rate (beta-blocker). Avoiding certain medicines. Starting an exercise program under the supervision of a health care provider. Follow these instructions at home: Medicines Take over-the-counter and prescription medicines only as told by your health care provider. Let your health care provider know about all prescription or over-the-counter medicines you take. These include herbs, vitamins, and supplements. You may need to stop or adjust some medicines if they cause this condition. Talk with your health care provider before starting any new medicines. Eating and drinking  Drink enough fluid to keep your urine pale yellow. If told by your health care provider, drink two glasses of water before getting up from a lying position. Follow instructions from your health care provider about how much sodium you should include in your diet. Avoid heavy meals. Eat several small meals a day instead of a few large meals. General instructions Do an aerobic exercise for 20 minutes a day, at least 3 days a week. Aerobic exercises are those that cause your heart to beat faster. Ask your health care provider what kinds of exercise are safe for   you. Do not use any products that contain nicotine or tobacco. These products include cigarettes, chewing tobacco, and vaping  devices, such as e-cigarettes. These can interfere with blood flow. If you need help quitting, ask your health care provider. Keep all follow-up visits. This is important. Contact a health care provider if: Your symptoms do not improve after treatment. Your symptoms get worse. You develop new symptoms. Get help right away if: You have chest pain. You have difficulty breathing. You have fainting episodes. These symptoms may be an emergency. Get help right away. Call 911. Do not wait to see if the symptoms will go away. Do not drive yourself to the hospital. Summary POTS is a group of symptoms that occur along with an increase in heart rate when a person stands up after lying down. The most common symptom is light-headedness when you stand up. Treatment for this condition includes treating any underlying conditions, drinking plenty of water, stopping or changing some medicines, or starting an exercise program. Get help right away if you have chest pain, difficulty breathing, or fainting episodes. These symptoms may be an emergency. This information is not intended to replace advice given to you by your health care provider. Make sure you discuss any questions you have with your health care provider. Document Revised: 10/10/2020 Document Reviewed: 10/10/2020 Elsevier Patient Education  2024 [employer] Inc.  

## 2022-12-10 LAB — CMP14+EGFR
ALT: 24 IU/L (ref 0–32)
AST: 18 IU/L (ref 0–40)
Albumin: 4.5 g/dL (ref 3.9–4.9)
Alkaline Phosphatase: 98 IU/L (ref 44–121)
BUN/Creatinine Ratio: 7 — ABNORMAL LOW (ref 9–23)
BUN: 8 mg/dL (ref 6–20)
Bilirubin Total: 0.6 mg/dL (ref 0.0–1.2)
CO2: 20 mmol/L (ref 20–29)
Calcium: 9.4 mg/dL (ref 8.7–10.2)
Chloride: 99 mmol/L (ref 96–106)
Creatinine, Ser: 1.07 mg/dL — ABNORMAL HIGH (ref 0.57–1.00)
Globulin, Total: 2.6 g/dL (ref 1.5–4.5)
Glucose: 109 mg/dL — ABNORMAL HIGH (ref 70–99)
Potassium: 3.8 mmol/L (ref 3.5–5.2)
Sodium: 135 mmol/L (ref 134–144)
Total Protein: 7.1 g/dL (ref 6.0–8.5)
eGFR: 69 mL/min/{1.73_m2} (ref >59)

## 2022-12-10 LAB — CBC WITH DIFFERENTIAL/PLATELET
Basophils Absolute: 0 10*3/uL (ref 0.0–0.2)
Basos: 0 %
EOS (ABSOLUTE): 0.2 10*3/uL (ref 0.0–0.4)
Eos: 2 %
Hematocrit: 42 % (ref 34.0–46.6)
Hemoglobin: 13.6 g/dL (ref 11.1–15.9)
Immature Grans (Abs): 0 10*3/uL (ref 0.0–0.1)
Immature Granulocytes: 0 %
Lymphocytes Absolute: 2.8 10*3/uL (ref 0.7–3.1)
Lymphs: 27 %
MCH: 28.1 pg (ref 26.6–33.0)
MCHC: 32.4 g/dL (ref 31.5–35.7)
MCV: 87 fL (ref 79–97)
Monocytes Absolute: 0.5 10*3/uL (ref 0.1–0.9)
Monocytes: 5 %
Neutrophils Absolute: 6.7 10*3/uL (ref 1.4–7.0)
Neutrophils: 66 %
Platelets: 298 10*3/uL (ref 150–450)
RBC: 4.84 x10E6/uL (ref 3.77–5.28)
RDW: 13.4 % (ref 11.7–15.4)
WBC: 10.3 10*3/uL (ref 3.4–10.8)

## 2022-12-10 LAB — TSH: TSH: 1.69 u[IU]/mL (ref 0.450–4.500)

## 2022-12-10 LAB — LIPASE: Lipase: 18 U/L (ref 14–72)

## 2022-12-10 NOTE — Progress Notes (Signed)
Normal lipase. WBC normal but upper limits. No signs of infection yesterday. Let me know if any symptoms develop.

## 2022-12-11 ENCOUNTER — Other Ambulatory Visit: Payer: Self-pay | Admitting: Physician Assistant

## 2022-12-11 DIAGNOSIS — F331 Major depressive disorder, recurrent, moderate: Secondary | ICD-10-CM

## 2022-12-18 ENCOUNTER — Other Ambulatory Visit: Payer: Self-pay | Admitting: Physician Assistant

## 2022-12-18 DIAGNOSIS — F411 Generalized anxiety disorder: Secondary | ICD-10-CM

## 2022-12-18 DIAGNOSIS — F439 Reaction to severe stress, unspecified: Secondary | ICD-10-CM

## 2022-12-22 ENCOUNTER — Ambulatory Visit: Payer: BC Managed Care – PPO | Admitting: Physician Assistant

## 2022-12-22 ENCOUNTER — Encounter: Payer: Self-pay | Admitting: Physician Assistant

## 2022-12-22 VITALS — BP 122/70 | HR 89 | Ht 64.0 in | Wt 315.0 lb

## 2022-12-22 DIAGNOSIS — E1169 Type 2 diabetes mellitus with other specified complication: Secondary | ICD-10-CM

## 2022-12-22 DIAGNOSIS — E1165 Type 2 diabetes mellitus with hyperglycemia: Secondary | ICD-10-CM | POA: Diagnosis not present

## 2022-12-22 DIAGNOSIS — G90A Postural orthostatic tachycardia syndrome (POTS): Secondary | ICD-10-CM

## 2022-12-22 DIAGNOSIS — I10 Essential (primary) hypertension: Secondary | ICD-10-CM

## 2022-12-22 DIAGNOSIS — E785 Hyperlipidemia, unspecified: Secondary | ICD-10-CM

## 2022-12-22 DIAGNOSIS — Z7984 Long term (current) use of oral hypoglycemic drugs: Secondary | ICD-10-CM

## 2022-12-22 DIAGNOSIS — R809 Proteinuria, unspecified: Secondary | ICD-10-CM

## 2022-12-22 DIAGNOSIS — Z7985 Long-term (current) use of injectable non-insulin antidiabetic drugs: Secondary | ICD-10-CM

## 2022-12-22 DIAGNOSIS — F331 Major depressive disorder, recurrent, moderate: Secondary | ICD-10-CM

## 2022-12-22 LAB — POCT UA - MICROALBUMIN
Creatinine, POC: 200 mg/dL
Microalbumin Ur, POC: 150 mg/L

## 2022-12-22 LAB — POCT GLYCOSYLATED HEMOGLOBIN (HGB A1C): Hemoglobin A1C: 6.3 % — AB (ref 4.0–5.6)

## 2022-12-22 NOTE — Progress Notes (Unsigned)
Aug 13 1972 Karen Stokes

## 2022-12-22 NOTE — Progress Notes (Signed)
Your microalbumin is a little elevated. This can be a precursor to kidney damage. Keeping BP under 130/80, staying hydrated, staying on jardiance can all help prevent further damage. Will continue to monitor.

## 2022-12-23 ENCOUNTER — Encounter: Payer: Self-pay | Admitting: Physician Assistant

## 2022-12-25 ENCOUNTER — Encounter: Payer: Self-pay | Admitting: Physician Assistant

## 2022-12-25 DIAGNOSIS — R809 Proteinuria, unspecified: Secondary | ICD-10-CM | POA: Insufficient documentation

## 2022-12-25 NOTE — Progress Notes (Signed)
Established Patient Office Visit  Subjective   Patient ID: Karen Stokes, female    DOB: 25-Jun-1985  Age: 37 y.o. MRN: [id number]  Chief Complaint  Patient presents with   Medical Management of Chronic Issues    HPI Pt is a 37 yo obese female with T2DM, HLD, HTN who presents to the clinic for follow up and medication refills.   She recently was dx with POTS due to not eating and drinking like she should with the increased dose of ozempic. She decreased back to .25mg  weekly and doing better. She still overall felt better on mounjaro than ozempic. Her dizziness has improved along with nausea and vomiting.   She has gained a few pounds since starting to eat again but would like to overall continue to lose weight and become more active.   .. Active Ambulatory Problems    Diagnosis Date Noted   Eosinophilic esophagitis    Irritable bowel syndrome    Palpitations 02/01/2018   Class 3 severe obesity due to excess calories with serious comorbidity and body mass index (BMI) of 60.0 to 69.9 in adult (HCC) 02/01/2018   Uncontrolled stage 2 hypertension 02/01/2018   Acute right-sided low back pain without sciatica 02/01/2018   Dyspnea on exertion 02/01/2018   GAD (generalized anxiety disorder) 02/01/2018   Severe episode of recurrent major depressive disorder, without psychotic features (HCC) 02/01/2018   Type 2 diabetes mellitus with hyperglycemia (HCC) 02/13/2018   Intractable migraine without aura and without status migrainosus 03/17/2018   Encounter for routine checking of intrauterine contraceptive device (IUD) 03/17/2018   Hyperlipidemia associated with type 2 diabetes mellitus (HCC) 05/09/2018   Primary hypertension 05/09/2018   Compulsive behaviors 05/09/2018   Diabetic eye exam (HCC) 05/09/2018   History of shingles 12/21/2018   Strain of rhomboid muscle 03/28/2019   Seasonal allergies 08/01/2019   Allergic conjunctivitis and rhinitis, bilateral 08/01/2019   Scalp itch  09/27/2019   Moderate episode of recurrent major depressive disorder (HCC) 12/27/2019   Stress at home 03/29/2020   Left lumbar radiculitis 10/22/2020   Mood disorder (HCC) 08/12/2021   Environmental allergies 12/10/2021   Tm (tympanic membrane disorder), bilateral 06/02/2022   TMJ tenderness, bilateral 06/02/2022   POTS (postural orthostatic tachycardia syndrome) 12/09/2022   Morbid obesity (HCC) 12/09/2022   Weight loss 12/09/2022   Microalbuminuria 12/25/2022   Resolved Ambulatory Problems    Diagnosis Date Noted   No Resolved Ambulatory Problems   Past Medical History:  Diagnosis Date   Allergy    Anxiety    Depression    GERD (gastroesophageal reflux disease)    Hypertension    Obesity      ROS See HPI>    Objective:     BP 122/70   Pulse 89   Ht 5\' 4"  (1.626 m)   Wt (!) 315 lb (142.9 kg)   SpO2 99%   BMI 54.07 kg/m  BP Readings from Last 3 Encounters:  12/22/22 122/70  09/21/22 109/87  09/04/22 127/74   Wt Readings from Last 3 Encounters:  12/22/22 (!) 315 lb (142.9 kg)  12/09/22 (!) 311 lb 1.9 oz (141.1 kg)  09/21/22 (!) 344 lb 2 oz (156.1 kg)      Physical Exam Constitutional:      Appearance: Normal appearance. She is obese.  HENT:     Head: Normocephalic.  Cardiovascular:     Rate and Rhythm: Normal rate and regular rhythm.     Heart sounds: Normal heart sounds.  Pulmonary:  Effort: Pulmonary effort is normal.     Breath sounds: Normal breath sounds.  Musculoskeletal:     Right lower leg: No edema.     Left lower leg: No edema.  Neurological:     General: No focal deficit present.     Mental Status: She is alert and oriented to person, place, and time.  Psychiatric:        Mood and Affect: Mood normal.      Results for orders placed or performed in visit on 12/22/22  POCT HgB A1C  Result Value Ref Range   Hemoglobin A1C 6.3 (A) 4.0 - 5.6 %   HbA1c POC (<> result, manual entry)     HbA1c, POC (prediabetic range)     HbA1c,  POC (controlled diabetic range)    POCT UA - Microalbumin  Result Value Ref Range   Microalbumin Ur, POC 150 mg/L   Creatinine, POC 200 mg/dL   Albumin/Creatinine Ratio, Urine, POC 30-300       Assessment & Plan:  Marland KitchenMarland KitchenLaruen was seen today for medical management of chronic issues.  Diagnoses and all orders for this visit:  Type 2 diabetes mellitus with hyperglycemia, without long-term current use of insulin (HCC) -     POCT HgB A1C -     POCT UA - Microalbumin -     Amb ref to Medical Nutrition Therapy-MNT  POTS (postural orthostatic tachycardia syndrome)  Morbid obesity (HCC) -     Amb ref to Medical Nutrition Therapy-MNT  Moderate episode of recurrent major depressive disorder (HCC)  Hyperlipidemia associated with type 2 diabetes mellitus (HCC) -     Amb ref to Medical Nutrition Therapy-MNT  Primary hypertension -     Amb ref to Medical Nutrition Therapy-MNT  Microalbuminuria    A1C much better and under 7  Continue same medications Nutrition referral made BP to goal, POTS symptoms resolved and vitals are much better Microalbumin abnormal, on ARB and SGLT-2 On statin Need eye and foot exam Declined vaccines today  Return in about 3 months (around 03/23/2023) for DM follow up.    Tandy Gaw, PA-C

## 2023-01-01 ENCOUNTER — Other Ambulatory Visit: Payer: Self-pay | Admitting: Physician Assistant

## 2023-01-01 DIAGNOSIS — I10 Essential (primary) hypertension: Secondary | ICD-10-CM

## 2023-01-28 ENCOUNTER — Other Ambulatory Visit: Payer: Self-pay | Admitting: Physician Assistant

## 2023-01-28 DIAGNOSIS — Z9109 Other allergy status, other than to drugs and biological substances: Secondary | ICD-10-CM

## 2023-01-28 DIAGNOSIS — J302 Other seasonal allergic rhinitis: Secondary | ICD-10-CM

## 2023-02-02 ENCOUNTER — Other Ambulatory Visit: Payer: Self-pay | Admitting: Physician Assistant

## 2023-02-02 DIAGNOSIS — M5416 Radiculopathy, lumbar region: Secondary | ICD-10-CM

## 2023-02-02 DIAGNOSIS — F411 Generalized anxiety disorder: Secondary | ICD-10-CM

## 2023-02-02 DIAGNOSIS — F439 Reaction to severe stress, unspecified: Secondary | ICD-10-CM

## 2023-02-04 ENCOUNTER — Telehealth: Payer: Self-pay | Admitting: Physician Assistant

## 2023-02-04 DIAGNOSIS — F439 Reaction to severe stress, unspecified: Secondary | ICD-10-CM

## 2023-02-04 DIAGNOSIS — M5416 Radiculopathy, lumbar region: Secondary | ICD-10-CM

## 2023-02-04 DIAGNOSIS — F411 Generalized anxiety disorder: Secondary | ICD-10-CM

## 2023-02-04 NOTE — Telephone Encounter (Signed)
Patient called to follow up on medication refills Clonazepam 0.5mg  and Gabapentin 300mg     Pharmacy [facility]  Phone [phone]

## 2023-02-05 MED ORDER — GABAPENTIN 300 MG PO CAPS: ORAL_CAPSULE | ORAL | 1 refills | Status: DC

## 2023-02-05 MED ORDER — CLONAZEPAM 0.5 MG PO TABS: ORAL_TABLET | ORAL | 1 refills | Status: DC

## 2023-02-05 NOTE — Telephone Encounter (Signed)
Sent!

## 2023-02-09 ENCOUNTER — Telehealth: Payer: BC Managed Care – PPO | Admitting: Nurse Practitioner

## 2023-02-09 DIAGNOSIS — J22 Unspecified acute lower respiratory infection: Secondary | ICD-10-CM | POA: Diagnosis not present

## 2023-02-09 MED ORDER — DOXYCYCLINE HYCLATE 100 MG PO TABS
100.0000 mg | ORAL_TABLET | Freq: Two times a day (BID) | ORAL | 0 refills | Status: AC
Start: 2023-02-09 — End: 2023-02-19

## 2023-02-09 NOTE — Progress Notes (Signed)
Virtual Visit Consent   Karen Stokes, you are scheduled for a virtual visit with a [facility] provider today. Just as with appointments in the office, your consent must be obtained to participate. Your consent will be active for this visit and any virtual visit you may have with one of our providers in the next 365 days. If you have a MyChart account, a copy of this consent can be sent to you electronically.  As this is a virtual visit, video technology does not allow for your provider to perform a traditional examination. This may limit your provider's ability to fully assess your condition. If your provider identifies any concerns that need to be evaluated in person or the need to arrange testing (such as labs, EKG, etc.), we will make arrangements to do so. Although advances in technology are sophisticated, we cannot ensure that it will always work on either your end or our end. If the connection with a video visit is poor, the visit may have to be switched to a telephone visit. With either a video or telephone visit, we are not always able to ensure that we have a secure connection.  By engaging in this virtual visit, you consent to the provision of healthcare and authorize for your insurance to be billed (if applicable) for the services provided during this visit. Depending on your insurance coverage, you may receive a charge related to this service.  I need to obtain your verbal consent now. Are you willing to proceed with your visit today? [name] has provided verbal consent on 02/09/2023 for a virtual visit (video or telephone). [name], FNP  Date: 02/09/2023 4:14 PM  Virtual Visit via Video Note   I, [name], connected with  Karen Stokes  ([id number], 1985/10/08) on 02/09/23 at  4:15 PM EDT by a video-enabled telemedicine application and verified that I am speaking with the correct person using two identifiers.  Location: Patient: Virtual Visit Location Patient:  Home Provider: Virtual Visit Location Provider: Home Office   I discussed the limitations of evaluation and management by telemedicine and the availability of in person appointments. The patient expressed understanding and agreed to proceed.    History of Present Illness: Karen Stokes is a 37 y.o. who identifies as a female who was assigned female at birth, and is being seen today with complaints of fever, cough- she does have some body aches but she describes not as severe as the flu  Initial symptoms was a headache   She did take a home COVID test yesterday that was negative   She does have a sore throat as well   Her mucous is dark, her cough is productive  Fever started with the cough  Denies tightness in chest or wheezing   She has been taking Dayquil and Nyquil without  She has sinus congestion along with chest congestion   Denies a history of asthma  Has not needed inhalers in the past   Denies a history of pneumonia    Problems:  Patient Active Problem List   Diagnosis Date Noted   Microalbuminuria 12/25/2022   POTS (postural orthostatic tachycardia syndrome) 12/09/2022   Morbid obesity (HCC) 12/09/2022   Weight loss 12/09/2022   Tm (tympanic membrane disorder), bilateral 06/02/2022   TMJ tenderness, bilateral 06/02/2022   Environmental allergies 12/10/2021   Mood disorder (HCC) 08/12/2021   Left lumbar radiculitis 10/22/2020   Stress at home 03/29/2020   Moderate episode of recurrent major depressive disorder (HCC) 12/27/2019  Scalp itch 09/27/2019   Seasonal allergies 08/01/2019   Allergic conjunctivitis and rhinitis, bilateral 08/01/2019   Strain of rhomboid muscle 03/28/2019   History of shingles 12/21/2018   Hyperlipidemia associated with type 2 diabetes mellitus (HCC) 05/09/2018   Primary hypertension 05/09/2018   Compulsive behaviors 05/09/2018   Diabetic eye exam (HCC) 05/09/2018   Intractable migraine without aura and without status migrainosus  03/17/2018   Encounter for routine checking of intrauterine contraceptive device (IUD) 03/17/2018   Type 2 diabetes mellitus with hyperglycemia (HCC) 02/13/2018   Palpitations 02/01/2018   Class 3 severe obesity due to excess calories with serious comorbidity and body mass index (BMI) of 60.0 to 69.9 in adult (HCC) 02/01/2018   Uncontrolled stage 2 hypertension 02/01/2018   Acute right-sided low back pain without sciatica 02/01/2018   Dyspnea on exertion 02/01/2018   GAD (generalized anxiety disorder) 02/01/2018   Severe episode of recurrent major depressive disorder, without psychotic features (HCC) 02/01/2018   Eosinophilic esophagitis    Irritable bowel syndrome     Allergies:  Allergies  Allergen Reactions   Amoxicillin-Pot Clavulanate Nausea And Vomiting   Metformin And Related Diarrhea    Severe GI upset   Morphine And Codeine Other (See Comments)    headaches    Trileptal [Oxcarbazepine] Other (See Comments)    hallucinations   Medications:  Current Outpatient Medications:    atorvastatin (LIPITOR) 80 MG tablet, TAKE 1 TABLET BY MOUTH DAILY, Disp: 90 tablet, Rfl: 3   azelastine (OPTIVAR) 0.05 % ophthalmic solution, Place 1 drop into both eyes 2 (two) times daily., Disp: 6 mL, Rfl: 11   buPROPion (WELLBUTRIN) 100 MG tablet, TAKE 1 TABLET BY MOUTH TWICE A DAY, Disp: 180 tablet, Rfl: 1   clonazePAM (KLONOPIN) 0.5 MG tablet, TAKE 1 TABLET BY MOUTH TWICE A DAY AS NEEDED FOR ANXIETY, Disp: 60 tablet, Rfl: 1   diphenhydrAMINE (BENADRYL) 25 MG tablet, Take 25 mg by mouth at bedtime., Disp: , Rfl:    ezetimibe (ZETIA) 10 MG tablet, TAKE 1 TABLET BY MOUTH DAILY, Disp: 90 tablet, Rfl: 3   FARXIGA 10 MG TABS tablet, TAKE 1 TABLET BY MOUTH DAILY, Disp: 90 tablet, Rfl: 3   FLUoxetine (PROZAC) 40 MG capsule, TAKE 1 CAPSULE BY MOUTH DAILY, Disp: 90 capsule, Rfl: 3   gabapentin (NEURONTIN) 300 MG capsule, TAKE 1 CAPSULE BY MOUTH THREE TIMES A DAY, Disp: 270 capsule, Rfl: 1   ibuprofen  (ADVIL) 800 MG tablet, Take 1 tablet (800 mg total) by mouth every 8 (eight) hours as needed., Disp: 90 tablet, Rfl: 0   lamoTRIgine (LAMICTAL) 25 MG tablet, TAKE 2 TABLETS BY MOUTH DAILY, Disp: 180 tablet, Rfl: 3   loratadine (CLARITIN) 10 MG tablet, Take 20 mg by mouth daily., Disp: , Rfl:    montelukast (SINGULAIR) 10 MG tablet, TAKE 1 TABLET BY MOUTH EVERYDAY AT BEDTIME, Disp: 90 tablet, Rfl: 3   omeprazole (PRILOSEC) 40 MG capsule, , Disp: , Rfl:    ondansetron (ZOFRAN-ODT) 4 MG disintegrating tablet, Take 1 tablet (4 mg total) by mouth every 8 (eight) hours as needed., Disp: 20 tablet, Rfl: 0   OZEMPIC, 0.25 OR 0.5 MG/DOSE, 2 MG/3ML SOPN, INJECT SUBCUTANEOUSLY 0.5 MG  EVERY WEEK, Disp: 9 mL, Rfl: 3   saline (AYR) GEL, Place 1 application into both nostrils., Disp: , Rfl:    tirzepatide (MOUNJARO) 2.5 MG/0.5ML Pen, Inject 2.5 mg into the skin once a week., Disp: 6 mL, Rfl: 0   valsartan-hydrochlorothiazide (DIOVAN-HCT) 320-25 MG tablet,  TAKE 1 TABLET BY MOUTH DAILY, Disp: 90 tablet, Rfl: 3  Observations/Objective: Patient is well-developed, well-nourished in no acute distress.  Resting comfortably  at home.  Head is normocephalic, atraumatic.  No labored breathing.  Speech is clear and coherent with logical content.  Patient is alert and oriented at baseline.    Assessment and Plan:  1. Lower respiratory tract infection  Continue to push fluids, rest  Switch from dayquil to Mucinex   - doxycycline (VIBRA-TABS) 100 MG tablet; Take 1 tablet (100 mg total) by mouth 2 (two) times daily for 10 days.  Dispense: 20 tablet; Refill: 0    If symptoms persist or with new concerns follow up as disussed  Follow Up Instructions: I discussed the assessment and treatment plan with the patient. The patient was provided an opportunity to ask questions and all were answered. The patient agreed with the plan and demonstrated an understanding of the instructions.  A copy of instructions were sent to  the patient via MyChart unless otherwise noted below.    The patient was advised to call back or seek an in-person evaluation if the symptoms worsen or if the condition fails to improve as anticipated.    [name], FNP

## 2023-03-23 ENCOUNTER — Ambulatory Visit: Payer: BC Managed Care – PPO | Admitting: Physician Assistant

## 2023-03-23 DIAGNOSIS — E1165 Type 2 diabetes mellitus with hyperglycemia: Secondary | ICD-10-CM

## 2023-03-23 DIAGNOSIS — E1169 Type 2 diabetes mellitus with other specified complication: Secondary | ICD-10-CM

## 2023-03-23 DIAGNOSIS — E1159 Type 2 diabetes mellitus with other circulatory complications: Secondary | ICD-10-CM

## 2023-04-27 ENCOUNTER — Ambulatory Visit: Payer: BC Managed Care – PPO | Admitting: Physician Assistant

## 2023-04-27 ENCOUNTER — Encounter: Payer: Self-pay | Admitting: Physician Assistant

## 2023-04-27 VITALS — BP 132/77 | HR 98 | Ht 64.0 in | Wt 305.0 lb

## 2023-04-27 DIAGNOSIS — I10 Essential (primary) hypertension: Secondary | ICD-10-CM | POA: Diagnosis not present

## 2023-04-27 DIAGNOSIS — E1169 Type 2 diabetes mellitus with other specified complication: Secondary | ICD-10-CM | POA: Diagnosis not present

## 2023-04-27 DIAGNOSIS — E1165 Type 2 diabetes mellitus with hyperglycemia: Secondary | ICD-10-CM | POA: Diagnosis not present

## 2023-04-27 DIAGNOSIS — Z7985 Long-term (current) use of injectable non-insulin antidiabetic drugs: Secondary | ICD-10-CM

## 2023-04-27 DIAGNOSIS — E785 Hyperlipidemia, unspecified: Secondary | ICD-10-CM

## 2023-04-27 LAB — POCT GLYCOSYLATED HEMOGLOBIN (HGB A1C): Hemoglobin A1C: 6.1 % — AB (ref 4.0–5.6)

## 2023-04-27 MED ORDER — TIRZEPATIDE 5 MG/0.5ML ~~LOC~~ SOAJ: 5.0000 mg | SUBCUTANEOUS | 0 refills | Status: DC

## 2023-04-27 NOTE — Patient Instructions (Signed)
 Increased mounjaro

## 2023-04-27 NOTE — Progress Notes (Signed)
 Established Patient Office Visit  Subjective   Patient ID: Karen Stokes, female    DOB: October 19, 1985  Age: 38 y.o. MRN: [id number]  Chief Complaint  Patient presents with   Medical Management of Chronic Issues    Last A1c 6.3    HPI Pt is a 38 yo obese female with T2DM, HTN, HLD, MDD, GAD who presents to the clinic for 3 month follow up.   She is not checking sugars. She is taking mounjaro  and farxiga . She has no concerns. She has lost 10lbs over 3 months. She is trying to be more active with dancing to music. She denies any CP, palpitations, headaches or vision changes. Denies any hypoglycemic events or dizziness.    .. Active Ambulatory Problems    Diagnosis Date Noted   Eosinophilic esophagitis    Irritable bowel syndrome    Palpitations 02/01/2018   Class 3 severe obesity due to excess calories with serious comorbidity and body mass index (BMI) of 60.0 to 69.9 in adult (HCC) 02/01/2018   Uncontrolled stage 2 hypertension 02/01/2018   Acute right-sided low back pain without sciatica 02/01/2018   Dyspnea on exertion 02/01/2018   GAD (generalized anxiety disorder) 02/01/2018   Severe episode of recurrent major depressive disorder, without psychotic features (HCC) 02/01/2018   Type 2 diabetes mellitus with hyperglycemia (HCC) 02/13/2018   Intractable migraine without aura and without status migrainosus 03/17/2018   Encounter for routine checking of intrauterine contraceptive device (IUD) 03/17/2018   Hyperlipidemia associated with type 2 diabetes mellitus (HCC) 05/09/2018   Primary hypertension 05/09/2018   Compulsive behaviors 05/09/2018   Diabetic eye exam (HCC) 05/09/2018   History of shingles 12/21/2018   Strain of rhomboid muscle 03/28/2019   Seasonal allergies 08/01/2019   Allergic conjunctivitis and rhinitis, bilateral 08/01/2019   Scalp itch 09/27/2019   Moderate episode of recurrent major depressive disorder (HCC) 12/27/2019   Stress at home 03/29/2020   Left  lumbar radiculitis 10/22/2020   Mood disorder (HCC) 08/12/2021   Environmental allergies 12/10/2021   Tm (tympanic membrane disorder), bilateral 06/02/2022   TMJ tenderness, bilateral 06/02/2022   POTS (postural orthostatic tachycardia syndrome) 12/09/2022   Morbid obesity (HCC) 12/09/2022   Weight loss 12/09/2022   Microalbuminuria 12/25/2022   Resolved Ambulatory Problems    Diagnosis Date Noted   No Resolved Ambulatory Problems   Past Medical History:  Diagnosis Date   Allergy    Anxiety    Depression    GERD (gastroesophageal reflux disease)    Hypertension    Obesity     Review of Systems  All other systems reviewed and are negative.     Objective:     BP 132/77   Pulse 98   Ht 5' 4 (1.626 m)   Wt (!) 305 lb (138.3 kg)   SpO2 99%   BMI 52.35 kg/m  BP Readings from Last 3 Encounters:  04/27/23 132/77  12/22/22 122/70  09/21/22 109/87   Wt Readings from Last 3 Encounters:  04/27/23 (!) 305 lb (138.3 kg)  12/22/22 (!) 315 lb (142.9 kg)  12/09/22 (!) 311 lb 1.9 oz (141.1 kg)   .Karen Stokes Results for orders placed or performed in visit on 04/27/23  POCT HgB A1C   Collection Time: 04/27/23  9:07 AM  Result Value Ref Range   Hemoglobin A1C 6.1 (A) 4.0 - 5.6 %   HbA1c POC (<> result, manual entry)     HbA1c, POC (prediabetic range)     HbA1c, POC (controlled diabetic  range)         Physical Exam Constitutional:      Appearance: Normal appearance. She is obese.  HENT:     Head: Normocephalic.  Cardiovascular:     Rate and Rhythm: Normal rate and regular rhythm.  Pulmonary:     Effort: Pulmonary effort is normal.     Breath sounds: Normal breath sounds.  Musculoskeletal:     Right lower leg: No edema.     Left lower leg: No edema.  Neurological:     General: No focal deficit present.     Mental Status: She is alert and oriented to person, place, and time.  Psychiatric:        Mood and Affect: Mood normal.          Assessment & Plan:  [name]  was seen today for medical management of chronic issues.  Diagnoses and all orders for this visit:  Type 2 diabetes mellitus with hyperglycemia, without long-term current use of insulin (HCC) -     POCT HgB A1C -     tirzepatide  (MOUNJARO ) 5 MG/0.5ML Pen; Inject 5 mg into the skin once a week. -     Lipid panel -     CMP14+EGFR  Morbid obesity (HCC) -     CMP14+EGFR  Primary hypertension -     CMP14+EGFR  Hyperlipidemia associated with type 2 diabetes mellitus (HCC)   A1C to goal Increased mounjaro  to help with further weight loss Cmp ordered BP to goal On statin Declined vaccines today Follow up in 3 months  Encouraged to schedule pap smear    Karen Stokes [place], PA-C

## 2023-04-28 ENCOUNTER — Encounter: Payer: Self-pay | Admitting: Physician Assistant

## 2023-04-28 LAB — LIPID PANEL
Chol/HDL Ratio: 3.4 {ratio} (ref 0.0–4.4)
Cholesterol, Total: 148 mg/dL (ref 100–199)
HDL: 44 mg/dL (ref >39)
LDL Chol Calc (NIH): 83 mg/dL (ref 0–99)
Triglycerides: 114 mg/dL (ref 0–149)
VLDL Cholesterol Cal: 21 mg/dL (ref 5–40)

## 2023-04-28 LAB — CMP14+EGFR
ALT: 34 [IU]/L — ABNORMAL HIGH (ref 0–32)
AST: 18 [IU]/L (ref 0–40)
Albumin: 4.1 g/dL (ref 3.9–4.9)
Alkaline Phosphatase: 99 [IU]/L (ref 44–121)
BUN/Creatinine Ratio: 14 (ref 9–23)
BUN: 13 mg/dL (ref 6–20)
Bilirubin Total: 0.4 mg/dL (ref 0.0–1.2)
CO2: 24 mmol/L (ref 20–29)
Calcium: 8.6 mg/dL — ABNORMAL LOW (ref 8.7–10.2)
Chloride: 101 mmol/L (ref 96–106)
Creatinine, Ser: 0.9 mg/dL (ref 0.57–1.00)
Globulin, Total: 2.4 g/dL (ref 1.5–4.5)
Glucose: 110 mg/dL — ABNORMAL HIGH (ref 70–99)
Potassium: 4.3 mmol/L (ref 3.5–5.2)
Sodium: 140 mmol/L (ref 134–144)
Total Protein: 6.5 g/dL (ref 6.0–8.5)
eGFR: 84 mL/min/{1.73_m2} (ref >59)

## 2023-04-28 NOTE — Progress Notes (Signed)
 Karen Stokes,   TG decreased from last check LDL almost to goal of under 70 at 83.  Are you taking lipitor and zetia ?

## 2023-05-11 ENCOUNTER — Other Ambulatory Visit: Payer: Self-pay | Admitting: Physician Assistant

## 2023-05-11 DIAGNOSIS — F411 Generalized anxiety disorder: Secondary | ICD-10-CM

## 2023-05-11 DIAGNOSIS — F439 Reaction to severe stress, unspecified: Secondary | ICD-10-CM

## 2023-06-09 ENCOUNTER — Other Ambulatory Visit: Payer: Self-pay | Admitting: Physician Assistant

## 2023-06-09 DIAGNOSIS — F331 Major depressive disorder, recurrent, moderate: Secondary | ICD-10-CM

## 2023-06-24 ENCOUNTER — Ambulatory Visit
Admission: EM | Admit: 2023-06-24 | Discharge: 2023-06-24 | Disposition: A | Discharge status: Home / Self Care | Attending: Family Medicine | Admitting: Family Medicine

## 2023-06-24 DIAGNOSIS — H6692 Otitis media, unspecified, left ear: Secondary | ICD-10-CM

## 2023-06-24 DIAGNOSIS — J069 Acute upper respiratory infection, unspecified: Secondary | ICD-10-CM

## 2023-06-24 DIAGNOSIS — R509 Fever, unspecified: Secondary | ICD-10-CM

## 2023-06-24 LAB — POC SARS CORONAVIRUS 2 AG -  ED: SARS Coronavirus 2 Ag: NEGATIVE

## 2023-06-24 LAB — POCT INFLUENZA A/B
Influenza A, POC: NEGATIVE
Influenza B, POC: NEGATIVE

## 2023-06-24 MED ORDER — BENZONATATE 200 MG PO CAPS: 200.0000 mg | ORAL_CAPSULE | Freq: Three times a day (TID) | ORAL | 0 refills | Status: AC | PRN

## 2023-06-24 MED ORDER — AZITHROMYCIN 250 MG PO TABS: 250.0000 mg | ORAL_TABLET | Freq: Every day | ORAL | 0 refills | Status: DC

## 2023-06-24 MED ORDER — PREDNISONE 20 MG PO TABS: ORAL_TABLET | ORAL | 0 refills | Status: DC

## 2023-06-24 MED ORDER — PROMETHAZINE-DM 6.25-15 MG/5ML PO SYRP: 5.0000 mL | ORAL_SOLUTION | Freq: Two times a day (BID) | ORAL | 0 refills | Status: DC | PRN

## 2023-06-24 NOTE — Discharge Instructions (Addendum)
 Advised patient to take medications as directed with food to completion.  Advised patient to take prednisone with Zithromax for the next 5 days.  Advised may take Tessalon capsules daily or as needed for cough.  Advised may take Promethazine DM at night for cough due to sedative effects.  Advised patient take OTC Tylenol 1 g every 6 hours for fever (oral temperature greater than 100.3).  Encouraged increase daily water intake to 64 ounces per day while taking these medications.  Advised if symptoms worsen and/or unresolved please follow-up with your PCP or here for further evaluation.

## 2023-06-24 NOTE — ED Triage Notes (Signed)
 Pt c/o nasal congestion and HA x 3 days. Fever of 102.3 last night. Taking nyquil/dayquil and sudafed prn.

## 2023-06-24 NOTE — ED Provider Notes (Signed)
 [name]    CSN: [id number] Arrival date & time: 06/24/23  1026      History   Chief Complaint Chief Complaint  Patient presents with   Nasal Congestion   Headache    HPI Karen Stokes is a 38 y.o. female.   HPI 64 cm and-year-old female presents with nasal congestion and headache for 3 days.  Patient reports fever of 102.3 last night.  PMH significant for morbid obesity, IBS, and HTN.  Patient is accompanied by her husband this morning.  Past Medical History:  Diagnosis Date   Allergy    Anxiety    Depression    Eosinophilic esophagitis    GERD (gastroesophageal reflux disease)    Hypertension    Irritable bowel syndrome    Obesity     Patient Active Problem List   Diagnosis Date Noted   Microalbuminuria 12/25/2022   POTS (postural orthostatic tachycardia syndrome) 12/09/2022   Morbid obesity (HCC) 12/09/2022   Weight loss 12/09/2022   Tm (tympanic membrane disorder), bilateral 06/02/2022   TMJ tenderness, bilateral 06/02/2022   Environmental allergies 12/10/2021   Mood disorder (HCC) 08/12/2021   Left lumbar radiculitis 10/22/2020   Stress at home 03/29/2020   Moderate episode of recurrent major depressive disorder (HCC) 12/27/2019   Scalp itch 09/27/2019   Seasonal allergies 08/01/2019   Allergic conjunctivitis and rhinitis, bilateral 08/01/2019   Strain of rhomboid muscle 03/28/2019   History of shingles 12/21/2018   Hyperlipidemia associated with type 2 diabetes mellitus (HCC) 05/09/2018   Primary hypertension 05/09/2018   Compulsive behaviors 05/09/2018   Diabetic eye exam (HCC) 05/09/2018   Intractable migraine without aura and without status migrainosus 03/17/2018   Encounter for routine checking of intrauterine contraceptive device (IUD) 03/17/2018   Type 2 diabetes mellitus with hyperglycemia (HCC) 02/13/2018   Palpitations 02/01/2018   Class 3 severe obesity due to excess calories with serious comorbidity and body mass index (BMI)  of 60.0 to 69.9 in adult (HCC) 02/01/2018   Uncontrolled stage 2 hypertension 02/01/2018   Acute right-sided low back pain without sciatica 02/01/2018   Dyspnea on exertion 02/01/2018   GAD (generalized anxiety disorder) 02/01/2018   Severe episode of recurrent major depressive disorder, without psychotic features (HCC) 02/01/2018   Eosinophilic esophagitis    Irritable bowel syndrome     Past Surgical History:  Procedure Laterality Date   CHOLECYSTECTOMY     ESOPHAGEAL DILATION     TONSILLECTOMY      OB History     Gravida  3   Para  1   Term      Preterm      AB  2   Living  1      SAB      IAB  2   Ectopic      Multiple      Live Births               Home Medications    Prior to Admission medications   Medication Sig Start Date End Date Taking? Authorizing Provider  atorvastatin (LIPITOR) 80 MG tablet TAKE 1 TABLET BY MOUTH DAILY 01/06/23   Breeback, Jade L, PA-C  azelastine (OPTIVAR) 0.05 % ophthalmic solution Place 1 drop into both eyes 2 (two) times daily. 08/01/19   Breeback, Jade L, PA-C  azithromycin (ZITHROMAX) 250 MG tablet Take 1 tablet (250 mg total) by mouth daily. Take first 2 tablets together, then 1 every day until finished. 06/24/23  Yes Trevor Iha,  FNP  benzonatate (TESSALON) 200 MG capsule Take 1 capsule (200 mg total) by mouth 3 (three) times daily as needed for up to 7 days. 06/24/23 07/01/23 Yes Trevor Iha, FNP  buPROPion (WELLBUTRIN) 100 MG tablet TAKE 1 TABLET BY MOUTH TWICE A DAY 06/11/23   Breeback, Jade L, PA-C  clonazePAM (KLONOPIN) 0.5 MG tablet TAKE 1 TABLET BY MOUTH TWICE A DAY AS NEEDED FOR ANXIETY 05/17/23   Breeback, Jade L, PA-C  diphenhydrAMINE (BENADRYL) 25 MG tablet Take 25 mg by mouth at bedtime.    [provider]  ezetimibe (ZETIA) 10 MG tablet TAKE 1 TABLET BY MOUTH DAILY 01/06/23   Breeback, Jade L, PA-C  FARXIGA 10 MG TABS tablet TAKE 1 TABLET BY MOUTH DAILY 08/12/22   Breeback, Jade L, PA-C  FLUoxetine  (PROZAC) 40 MG capsule TAKE 1 CAPSULE BY MOUTH DAILY 10/06/22   Breeback, Jade L, PA-C  gabapentin (NEURONTIN) 300 MG capsule TAKE 1 CAPSULE BY MOUTH THREE TIMES A DAY 02/05/23   Breeback, Jade L, PA-C  ibuprofen (ADVIL) 800 MG tablet Take 1 tablet (800 mg total) by mouth every 8 (eight) hours as needed. 05/21/20   Breeback, Lonna Cobb, PA-C  lamoTRIgine (LAMICTAL) 25 MG tablet TAKE 2 TABLETS BY MOUTH DAILY 12/16/22   Breeback, Jade L, PA-C  loratadine (CLARITIN) 10 MG tablet Take 20 mg by mouth daily.    [provider]  montelukast (SINGULAIR) 10 MG tablet TAKE 1 TABLET BY MOUTH EVERYDAY AT BEDTIME 01/28/23   Breeback, Jade L, PA-C  omeprazole (PRILOSEC) 40 MG capsule     [provider]  ondansetron (ZOFRAN-ODT) 4 MG disintegrating tablet Take 1 tablet (4 mg total) by mouth every 8 (eight) hours as needed. 10/22/22   [name], PA-C  predniSONE (DELTASONE) 20 MG tablet Take 3 tabs PO daily x 5 days. 06/24/23  Yes Trevor Iha, FNP  promethazine-dextromethorphan (PROMETHAZINE-DM) 6.25-15 MG/5ML syrup Take 5 mLs by mouth 2 (two) times daily as needed for cough. 06/24/23  Yes Trevor Iha, FNP  saline (AYR) GEL Place 1 application into both nostrils.    [provider]  tirzepatide Greggory Keen) 5 MG/0.5ML Pen Inject 5 mg into the skin once a week. 04/27/23   Breeback, Jade L, PA-C  valsartan-hydrochlorothiazide (DIOVAN-HCT) 320-25 MG tablet TAKE 1 TABLET BY MOUTH DAILY 01/06/23   Jomarie Longs, PA-C    Family History Family History  Problem Relation Age of Onset   Irritable bowel syndrome Mother    Anxiety disorder Mother    Depression Mother    Cirrhosis Father    Hypertension Maternal Grandfather    Heart attack Maternal Grandfather    Stroke Maternal Grandfather     Social History Social History   Tobacco Use   Smoking status: Never   Smokeless tobacco: Never  Vaping Use   Vaping status: Never Used  Substance Use Topics   Alcohol use: Never   Drug  use: Never     Allergies   Amoxicillin-pot clavulanate, Metformin and related, Morphine and codeine, and Trileptal [oxcarbazepine]   Review of Systems Review of Systems   Physical Exam Triage Vital Signs ED Triage Vitals [06/24/23 1036]  Encounter Vitals Group     BP 135/85     Systolic BP Percentile      Diastolic BP Percentile      Pulse Rate (!) 102     Resp 17     Temp 98.8 F (37.1 C)     Temp Source Oral  SpO2 95 %     Weight      Height      Head Circumference      Peak Flow      Pain Score 5     Pain Loc      Pain Education      Exclude from Growth Chart    No data found.  Updated Vital Signs BP 135/85 (BP Location: Left Wrist)   Pulse (!) 102   Temp 98.8 F (37.1 C) (Oral)   Resp 17   SpO2 95%   Visual Acuity Right Eye Distance:   Left Eye Distance:   Bilateral Distance:    Right Eye Near:   Left Eye Near:    Bilateral Near:     Physical Exam Vitals and nursing note reviewed.  Constitutional:      Appearance: Normal appearance. She is obese. She is ill-appearing.  HENT:     Head: Normocephalic and atraumatic.     Right Ear: Tympanic membrane, ear canal and external ear normal.     Left Ear: Ear canal and external ear normal.     Ears:     Comments: Left TM: Erythematous, bulging, red rimmed    Mouth/Throat:     Mouth: Mucous membranes are moist.     Pharynx: Oropharynx is clear.  Eyes:     Extraocular Movements: Extraocular movements intact.     Conjunctiva/sclera: Conjunctivae normal.     Pupils: Pupils are equal, round, and reactive to light.  Cardiovascular:     Rate and Rhythm: Normal rate and regular rhythm.     Pulses: Normal pulses.     Heart sounds: Normal heart sounds.  Pulmonary:     Effort: Pulmonary effort is normal.     Breath sounds: Normal breath sounds. No wheezing, rhonchi or rales.     Comments: Frequent nonproductive cough on exam Chest:     Chest wall: No tenderness.  Musculoskeletal:        General:  Normal range of motion.     Cervical back: Normal range of motion and neck supple.  Skin:    General: Skin is warm and dry.  Neurological:     General: No focal deficit present.     Mental Status: She is alert and oriented to person, place, and time. Mental status is at baseline.  Psychiatric:        Mood and Affect: Mood normal.        Behavior: Behavior normal.      UC Treatments / Results  Labs (all labs ordered are listed, but only abnormal results are displayed) Labs Reviewed  POCT INFLUENZA A/B  POC SARS CORONAVIRUS 2 AG -  ED    EKG   Radiology No results found.  Procedures Procedures (including critical care time)  Medications Ordered in UC Medications - No data to display  Initial Impression / Assessment and Plan / UC Course  I have reviewed the triage vital signs and the nursing notes.  Pertinent labs & imaging results that were available during my care of the patient were reviewed by me and considered in my medical decision making (see chart for details).     MDM: 1.  Viral URI with cough-Influenza and COVID-19 negative today, Rx'd prednisone 20 mg tablet: Take 3 tablets p.o. daily x 5 days, Rx'd Tessalon 200 mg capsules: Take 1 capsule 3 times daily, as needed for cough, Rx'd Promethazine DM 6.25-15 Mg/5 mL syrup: Take 5 mL twice daily, as  needed for cough; 2.  Fever, unspecified -Advised patient take OTC Tylenol 1 g every 6 hours for fever (oral temperature greater than 100.3).  3.  Left acute otitis media-Rx'd Zithromax 500 mg day 1, then 250 mg day 2-5. Advised may take Tessalon capsules daily or as needed for cough.  Advised may take Promethazine DM at night for cough due to sedative effects.  Advised patient take OTC Tylenol 1 g every 6 hours for fever (oral temperature greater than 100.3).  Encouraged increase daily water intake to 64 ounces per day while taking these medications.  Advised if symptoms worsen and/or unresolved please follow-up with your PCP  or here for further evaluation.  Work note provided to patient prior to discharge.  Patient discharged home, hemodynamically stable. Final Clinical Impressions(s) / UC Diagnoses   Final diagnoses:  Viral URI with cough  Fever, unspecified  Left acute otitis media     Discharge Instructions      Advised patient to take medications as directed with food to completion.  Advised patient to take prednisone with Zithromax for the next 5 days.  Advised may take Tessalon capsules daily or as needed for cough.  Advised may take Promethazine DM at night for cough due to sedative effects.  Advised patient take OTC Tylenol 1 g every 6 hours for fever (oral temperature greater than 100.3).  Encouraged increase daily water intake to 64 ounces per day while taking these medications.  Advised if symptoms worsen and/or unresolved please follow-up with your PCP or here for further evaluation.     ED Prescriptions     Medication Sig Dispense Auth. Provider   azithromycin (ZITHROMAX) 250 MG tablet Take 1 tablet (250 mg total) by mouth daily. Take first 2 tablets together, then 1 every day until finished. 6 tablet Trevor Iha, FNP   predniSONE (DELTASONE) 20 MG tablet Take 3 tabs PO daily x 5 days. 15 tablet Trevor Iha, FNP   benzonatate (TESSALON) 200 MG capsule Take 1 capsule (200 mg total) by mouth 3 (three) times daily as needed for up to 7 days. 40 capsule Trevor Iha, FNP   promethazine-dextromethorphan (PROMETHAZINE-DM) 6.25-15 MG/5ML syrup Take 5 mLs by mouth 2 (two) times daily as needed for cough. 118 mL Trevor Iha, FNP      PDMP not reviewed this encounter.   Trevor Iha, FNP 06/24/23 1128

## 2023-07-13 ENCOUNTER — Other Ambulatory Visit: Payer: Self-pay | Admitting: Physician Assistant

## 2023-07-13 DIAGNOSIS — E1165 Type 2 diabetes mellitus with hyperglycemia: Secondary | ICD-10-CM

## 2023-07-24 ENCOUNTER — Other Ambulatory Visit: Payer: Self-pay | Admitting: Physician Assistant

## 2023-07-24 DIAGNOSIS — E1165 Type 2 diabetes mellitus with hyperglycemia: Secondary | ICD-10-CM

## 2023-07-26 ENCOUNTER — Encounter: Payer: Self-pay | Admitting: Physician Assistant

## 2023-07-26 ENCOUNTER — Ambulatory Visit (INDEPENDENT_AMBULATORY_CARE_PROVIDER_SITE_OTHER): Payer: BC Managed Care – PPO | Admitting: Physician Assistant

## 2023-07-26 VITALS — BP 111/57 | HR 86 | Ht 64.0 in | Wt 296.0 lb

## 2023-07-26 DIAGNOSIS — F331 Major depressive disorder, recurrent, moderate: Secondary | ICD-10-CM

## 2023-07-26 DIAGNOSIS — Z7984 Long term (current) use of oral hypoglycemic drugs: Secondary | ICD-10-CM

## 2023-07-26 DIAGNOSIS — E1165 Type 2 diabetes mellitus with hyperglycemia: Secondary | ICD-10-CM | POA: Diagnosis not present

## 2023-07-26 DIAGNOSIS — I10 Essential (primary) hypertension: Secondary | ICD-10-CM | POA: Diagnosis not present

## 2023-07-26 DIAGNOSIS — Z7985 Long-term (current) use of injectable non-insulin antidiabetic drugs: Secondary | ICD-10-CM

## 2023-07-26 DIAGNOSIS — M5416 Radiculopathy, lumbar region: Secondary | ICD-10-CM | POA: Diagnosis not present

## 2023-07-26 LAB — POCT GLYCOSYLATED HEMOGLOBIN (HGB A1C): Hemoglobin A1C: 6 % — AB (ref 4.0–5.6)

## 2023-07-26 MED ORDER — GABAPENTIN 300 MG PO CAPS: ORAL_CAPSULE | ORAL | 1 refills | Status: DC

## 2023-07-26 MED ORDER — BUPROPION HCL ER (SR) 150 MG PO TB12
150.0000 mg | ORAL_TABLET | Freq: Two times a day (BID) | ORAL | 1 refills | Status: DC
Start: 2023-07-26 — End: 2023-12-16

## 2023-07-26 NOTE — Progress Notes (Signed)
 Established Patient Office Visit  Subjective   Patient ID: Karen Stokes, female    DOB: 04-21-1985  Age: 38 y.o. MRN: [id number]  Chief Complaint  Patient presents with   Medical Management of Chronic Issues    Type 2 diabetes mellitus with hyperglycemia, without long-term current use of insulin (HCC) Las A1c 6.1    HPI Pt is a 38 yo obese female with T2DM, HTN, HLD, MDD, GAD who presents to the clinic for 3 month follow up.   She is not checking her sugars at home. She denies any hypoglycemic events. She takes [place] and [place]. She is not exercising or dieting. She denies any CP, palpitations, headaches or vision changes.   Her depression and anxiety are up some because of life events and her daughter struggling with mood. They are financially strained right now as well because of the economy.    Review of Systems  All other systems reviewed and are negative.     Objective:     BP (!) 111/57   Pulse 86   Ht 5\' 4"  (1.626 m)   Wt 296 lb (134.3 kg)   SpO2 99%   BMI 50.81 kg/m  BP Readings from Last 3 Encounters:  07/26/23 (!) 111/57  06/24/23 135/85  04/27/23 132/77   Wt Readings from Last 3 Encounters:  07/26/23 296 lb (134.3 kg)  04/27/23 (!) 305 lb (138.3 kg)  12/22/22 (!) 315 lb (142.9 kg)    ..    07/26/2023    8:54 AM 04/27/2023    9:57 AM 04/27/2023    9:13 AM 04/27/2023    9:07 AM 09/21/2022    4:45 PM  Depression screen PHQ 2/9  Decreased Interest 0 1 0 0 1  Down, Depressed, Hopeless 0 1 0 0 1  PHQ - 2 Score 0 2 0 0 2  Altered sleeping  1     Tired, decreased energy  1     Change in appetite  1     Feeling bad or failure about yourself   1     Trouble concentrating  1     Moving slowly or fidgety/restless  1     Suicidal thoughts  1     PHQ-9 Score  9     Difficult doing work/chores  Somewhat difficult      ..    04/27/2023    9:57 AM 06/02/2022    9:32 AM 12/10/2021    9:17 AM 08/05/2021   10:24 AM  GAD 7 : Generalized Anxiety Score   Nervous, Anxious, on Edge 1 1 2 2   Control/stop worrying 1 1 2 1   Worry too much - different things 1 1 2 1   Trouble relaxing 1 1 2 2   Restless 1 1 1 2   Easily annoyed or irritable 1 1 1 1   Afraid - awful might happen 1 1 1  0  Total GAD 7 Score 7 7 11 9   Anxiety Difficulty Somewhat difficult Somewhat difficult Extremely difficult Somewhat difficult    .Marland Kitchen Results for orders placed or performed in visit on 07/26/23  POCT glycosylated hemoglobin (Hb A1C)   Collection Time: 07/26/23  8:58 AM  Result Value Ref Range   Hemoglobin A1C 6.0 (A) 4.0 - 5.6 %   HbA1c POC (<> result, manual entry)     HbA1c, POC (prediabetic range)     HbA1c, POC (controlled diabetic range)       Physical Exam Constitutional:      Appearance:  Normal appearance. She is obese.  HENT:     Head: Normocephalic.  Cardiovascular:     Rate and Rhythm: Normal rate and regular rhythm.  Pulmonary:     Effort: Pulmonary effort is normal.     Breath sounds: Normal breath sounds.  Neurological:     General: No focal deficit present.     Mental Status: She is alert and oriented to person, place, and time.  Psychiatric:        Mood and Affect: Mood normal.        Assessment & Plan:  Aaron AasAaron AasSerita was seen today for medical management of chronic issues.  Diagnoses and all orders for this visit:  Type 2 diabetes mellitus with hyperglycemia, without long-term current use of insulin (HCC) -     POCT glycosylated hemoglobin (Hb A1C)  Left lumbar radiculitis -     gabapentin (NEURONTIN) 300 MG capsule; TAKE 1 CAPSULE BY MOUTH THREE TIMES A DAY  Primary hypertension  Moderate episode of recurrent major depressive disorder (HCC) -     buPROPion (WELLBUTRIN SR) 150 MG 12 hr tablet; Take 1 tablet (150 mg total) by mouth 2 (two) times daily.   A1C is to goal Continue same medications mounjaro and farxiga BP to goal, on arb On zetia and lipitor Needs eye exam Foot exam UTD Vaccines UTD Follow up in 3  months  Wellbutrin increased to 150mg  bid Continue prozac, lamictal and klonapin Follow up in 3 months   Return in about 3 months (around 10/25/2023).    Sydney Azure, PA-C

## 2023-07-26 NOTE — Patient Instructions (Signed)
 Increased wellbutrin to 150mg  twice a day.

## 2023-09-03 ENCOUNTER — Other Ambulatory Visit: Payer: Self-pay | Admitting: Physician Assistant

## 2023-09-03 DIAGNOSIS — F411 Generalized anxiety disorder: Secondary | ICD-10-CM

## 2023-09-03 DIAGNOSIS — F439 Reaction to severe stress, unspecified: Secondary | ICD-10-CM

## 2023-09-03 NOTE — Telephone Encounter (Signed)
 Last OV: 07/26/23 Next OV: 10/26/23 Last RF: 05/17/23

## 2023-09-16 ENCOUNTER — Telehealth: Admitting: Physician Assistant

## 2023-09-16 DIAGNOSIS — R6889 Other general symptoms and signs: Secondary | ICD-10-CM

## 2023-09-16 NOTE — Progress Notes (Signed)

## 2023-10-26 ENCOUNTER — Ambulatory Visit: Admitting: Physician Assistant

## 2023-10-26 ENCOUNTER — Encounter: Payer: Self-pay | Admitting: Physician Assistant

## 2023-10-26 VITALS — BP 104/49 | HR 94 | Ht 64.0 in | Wt 298.0 lb

## 2023-10-26 DIAGNOSIS — F4329 Adjustment disorder with other symptoms: Secondary | ICD-10-CM | POA: Diagnosis not present

## 2023-10-26 DIAGNOSIS — F331 Major depressive disorder, recurrent, moderate: Secondary | ICD-10-CM

## 2023-10-26 DIAGNOSIS — E1165 Type 2 diabetes mellitus with hyperglycemia: Secondary | ICD-10-CM

## 2023-10-26 DIAGNOSIS — G250 Essential tremor: Secondary | ICD-10-CM | POA: Diagnosis not present

## 2023-10-26 DIAGNOSIS — I10 Essential (primary) hypertension: Secondary | ICD-10-CM

## 2023-10-26 DIAGNOSIS — Z7985 Long-term (current) use of injectable non-insulin antidiabetic drugs: Secondary | ICD-10-CM

## 2023-10-26 DIAGNOSIS — W19XXXA Unspecified fall, initial encounter: Secondary | ICD-10-CM

## 2023-10-26 LAB — POCT GLYCOSYLATED HEMOGLOBIN (HGB A1C): Hemoglobin A1C: 6 % — AB (ref 4.0–5.6)

## 2023-10-26 MED ORDER — PROPRANOLOL HCL 10 MG PO TABS: 10.0000 mg | ORAL_TABLET | Freq: Two times a day (BID) | ORAL | 0 refills | Status: DC

## 2023-10-26 MED ORDER — LAMOTRIGINE 25 MG PO TABS: 75.0000 mg | ORAL_TABLET | Freq: Every day | ORAL | 0 refills | Status: DC

## 2023-10-26 MED ORDER — HYDROXYZINE HCL 10 MG PO TABS: 10.0000 mg | ORAL_TABLET | Freq: Three times a day (TID) | ORAL | 0 refills | Status: DC | PRN

## 2023-10-26 NOTE — Patient Instructions (Signed)
 Continue same diabetic mediations Start propranolol  twice a day for tremors Use hydroxyzine  as needed for anxiety Increased lamictal  to 75mg  a day.

## 2023-10-29 ENCOUNTER — Encounter: Payer: Self-pay | Admitting: Physician Assistant

## 2023-10-29 DIAGNOSIS — G250 Essential tremor: Secondary | ICD-10-CM | POA: Insufficient documentation
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
## 2023-11-03 ENCOUNTER — Encounter: Payer: Self-pay | Admitting: Physician Assistant

## 2023-11-03 DIAGNOSIS — F4329 Adjustment disorder with other symptoms: Secondary | ICD-10-CM

## 2023-11-03 DIAGNOSIS — F411 Generalized anxiety disorder: Secondary | ICD-10-CM

## 2023-11-03 DIAGNOSIS — F332 Major depressive disorder, recurrent severe without psychotic features: Secondary | ICD-10-CM

## 2023-11-09 ENCOUNTER — Encounter: Payer: Self-pay | Admitting: Physician Assistant

## 2023-11-09 ENCOUNTER — Ambulatory Visit (INDEPENDENT_AMBULATORY_CARE_PROVIDER_SITE_OTHER): Admitting: Professional

## 2023-11-09 ENCOUNTER — Encounter: Payer: Self-pay | Admitting: Professional

## 2023-11-09 DIAGNOSIS — F411 Generalized anxiety disorder: Secondary | ICD-10-CM

## 2023-11-09 DIAGNOSIS — F331 Major depressive disorder, recurrent, moderate: Secondary | ICD-10-CM | POA: Diagnosis not present

## 2023-11-09 NOTE — Progress Notes (Signed)
   Karen Stokes, [facility]
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
## 2023-11-26 ENCOUNTER — Ambulatory Visit (INDEPENDENT_AMBULATORY_CARE_PROVIDER_SITE_OTHER): Admitting: Professional

## 2023-11-26 ENCOUNTER — Encounter: Payer: Self-pay | Admitting: Professional

## 2023-11-26 DIAGNOSIS — F331 Major depressive disorder, recurrent, moderate: Secondary | ICD-10-CM | POA: Diagnosis not present

## 2023-11-26 DIAGNOSIS — F411 Generalized anxiety disorder: Secondary | ICD-10-CM

## 2023-11-26 NOTE — Progress Notes (Signed)
   Nathanel Collet, [facility]
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
## 2023-12-08 ENCOUNTER — Ambulatory Visit (INDEPENDENT_AMBULATORY_CARE_PROVIDER_SITE_OTHER): Admitting: Professional

## 2023-12-08 ENCOUNTER — Encounter: Payer: Self-pay | Admitting: Professional

## 2023-12-08 DIAGNOSIS — F411 Generalized anxiety disorder: Secondary | ICD-10-CM

## 2023-12-08 DIAGNOSIS — F331 Major depressive disorder, recurrent, moderate: Secondary | ICD-10-CM | POA: Diagnosis not present
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
## 2023-12-14 ENCOUNTER — Other Ambulatory Visit: Payer: Self-pay | Admitting: Physician Assistant

## 2023-12-14 DIAGNOSIS — I10 Essential (primary) hypertension: Secondary | ICD-10-CM

## 2023-12-15 ENCOUNTER — Other Ambulatory Visit: Payer: Self-pay | Admitting: Physician Assistant

## 2023-12-15 DIAGNOSIS — F331 Major depressive disorder, recurrent, moderate: Secondary | ICD-10-CM

## 2023-12-16 ENCOUNTER — Other Ambulatory Visit: Payer: Self-pay | Admitting: Physician Assistant

## 2023-12-16 DIAGNOSIS — F411 Generalized anxiety disorder: Secondary | ICD-10-CM

## 2023-12-16 DIAGNOSIS — F439 Reaction to severe stress, unspecified: Secondary | ICD-10-CM

## 2023-12-19 ENCOUNTER — Telehealth: Admitting: Family

## 2023-12-19 ENCOUNTER — Other Ambulatory Visit: Payer: Self-pay | Admitting: Physician Assistant

## 2023-12-19 ENCOUNTER — Encounter

## 2023-12-19 DIAGNOSIS — F411 Generalized anxiety disorder: Secondary | ICD-10-CM

## 2023-12-19 DIAGNOSIS — G90A Postural orthostatic tachycardia syndrome (POTS): Secondary | ICD-10-CM

## 2023-12-19 DIAGNOSIS — R110 Nausea: Secondary | ICD-10-CM | POA: Diagnosis not present

## 2023-12-19 DIAGNOSIS — E1165 Type 2 diabetes mellitus with hyperglycemia: Secondary | ICD-10-CM | POA: Diagnosis not present

## 2023-12-19 DIAGNOSIS — F331 Major depressive disorder, recurrent, moderate: Secondary | ICD-10-CM

## 2023-12-19 DIAGNOSIS — F332 Major depressive disorder, recurrent severe without psychotic features: Secondary | ICD-10-CM

## 2023-12-19 DIAGNOSIS — F4329 Adjustment disorder with other symptoms: Secondary | ICD-10-CM

## 2023-12-19 MED ORDER — TIRZEPATIDE 2.5 MG/0.5ML ~~LOC~~ SOAJ: 2.5000 mg | SUBCUTANEOUS | 0 refills | Status: DC

## 2023-12-19 MED ORDER — ONDANSETRON 4 MG PO TBDP: 4.0000 mg | ORAL_TABLET | Freq: Three times a day (TID) | ORAL | 0 refills | Status: AC | PRN

## 2023-12-19 NOTE — Progress Notes (Signed)
 Virtual Visit Consent   Karen Stokes, you are scheduled for a virtual visit with a [facility] provider today. Just as with appointments in the office, your consent must be obtained to participate. Your consent will be active for this visit and any virtual visit you may have with one of our providers in the next 365 days. If you have a MyChart account, a copy of this consent can be sent to you electronically.  As this is a virtual visit, video technology does not allow for your provider to perform a traditional examination. This may limit your provider's ability to fully assess your condition. If your provider identifies any concerns that need to be evaluated in person or the need to arrange testing (such as labs, EKG, etc.), we will make arrangements to do so. Although advances in technology are sophisticated, we cannot ensure that it will always work on either your end or our end. If the connection with a video visit is poor, the visit may have to be switched to a telephone visit. With either a video or telephone visit, we are not always able to ensure that we have a secure connection.  By engaging in this virtual visit, you consent to the provision of healthcare and authorize for your insurance to be billed (if applicable) for the services provided during this visit. Depending on your insurance coverage, you may receive a charge related to this service.  I need to obtain your verbal consent now. Are you willing to proceed with your visit today? Karen Stokes has provided verbal consent on 12/19/2023 for a virtual visit (video or telephone). Bari Learn, FNP  Date: 12/19/2023 12:33 PM   Virtual Visit via Video Note   I, Bari Learn, connected with  Karen Stokes  ([id number], February 18, 1986) on 12/19/23 at 12:30 PM EDT by a video-enabled telemedicine application and verified that I am speaking with the correct person using two identifiers.  Location: Patient: Virtual Visit Location Patient:  Home Provider: Virtual Visit Location Provider: Home Office   I discussed the limitations of evaluation and management by telemedicine and the availability of in person appointments. The patient expressed understanding and agreed to proceed.    History of Present Illness: Karen Stokes is a 38 y.o. who identifies as a female who was assigned female at birth, and is being seen today for nausea that started  two days ago. She reports she has chronic nausea on and off. Reports she has POTS and on Mounjaro  5 mg.   She is a diabetic and her last A1C was 6.0.  HPI: HPI  Problems:  Patient Active Problem List   Diagnosis Date Noted   Essential tremor 10/29/2023   Stress and adjustment reaction 10/26/2023   Fall 10/26/2023   Microalbuminuria 12/25/2022   POTS (postural orthostatic tachycardia syndrome) 12/09/2022   Morbid obesity (HCC) 12/09/2022   Weight loss 12/09/2022   Tm (tympanic membrane disorder), bilateral 06/02/2022   TMJ tenderness, bilateral 06/02/2022   Environmental allergies 12/10/2021   Mood disorder (HCC) 08/12/2021   Left lumbar radiculitis 10/22/2020   Stress at home 03/29/2020   Moderate episode of recurrent major depressive disorder (HCC) 12/27/2019   Scalp itch 09/27/2019   Seasonal allergies 08/01/2019   Allergic conjunctivitis and rhinitis, bilateral 08/01/2019   Strain of rhomboid muscle 03/28/2019   History of shingles 12/21/2018   Hyperlipidemia associated with type 2 diabetes mellitus (HCC) 05/09/2018   Primary hypertension 05/09/2018   Compulsive behaviors 05/09/2018   Diabetic eye exam (HCC) 05/09/2018  Intractable migraine without aura and without status migrainosus 03/17/2018   Encounter for routine checking of intrauterine contraceptive device (IUD) 03/17/2018   Type 2 diabetes mellitus with hyperglycemia (HCC) 02/13/2018   Palpitations 02/01/2018   Class 3 severe obesity due to excess calories with serious comorbidity and body mass index (BMI) of  60.0 to 69.9 in adult 02/01/2018   Uncontrolled stage 2 hypertension 02/01/2018   Acute right-sided low back pain without sciatica 02/01/2018   Dyspnea on exertion 02/01/2018   GAD (generalized anxiety disorder) 02/01/2018   Severe episode of recurrent major depressive disorder, without psychotic features (HCC) 02/01/2018   Eosinophilic esophagitis    Irritable bowel syndrome     Allergies:  Allergies  Allergen Reactions   Amoxicillin-Pot Clavulanate Nausea And Vomiting   Metformin  And Related Diarrhea    Severe GI upset   Morphine And Codeine Other (See Comments)    headaches    Trileptal [Oxcarbazepine] Other (See Comments)    hallucinations   Medications:  Current Outpatient Medications:    ondansetron  (ZOFRAN -ODT) 4 MG disintegrating tablet, Take 1 tablet (4 mg total) by mouth every 8 (eight) hours as needed for nausea or vomiting., Disp: 20 tablet, Rfl: 0   tirzepatide  (MOUNJARO ) 2.5 MG/0.5ML Pen, Inject 2.5 mg into the skin once a week., Disp: 2 mL, Rfl: 0   atorvastatin  (LIPITOR) 80 MG tablet, TAKE 1 TABLET BY MOUTH DAILY, Disp: 90 tablet, Rfl: 3   azelastine  (OPTIVAR ) 0.05 % ophthalmic solution, Place 1 drop into both eyes 2 (two) times daily., Disp: 6 mL, Rfl: 11   buPROPion  (WELLBUTRIN  SR) 150 MG 12 hr tablet, TAKE 1 TABLET BY MOUTH TWICE  DAILY, Disp: 180 tablet, Rfl: 3   clonazePAM  (KLONOPIN ) 0.5 MG tablet, TAKE 1 TABLET BY MOUTH TWICE A DAY AS NEEDED FOR ANXIETY, Disp: 60 tablet, Rfl: 1   diphenhydrAMINE (BENADRYL) 25 MG tablet, Take 25 mg by mouth at bedtime., Disp: , Rfl:    ezetimibe  (ZETIA ) 10 MG tablet, TAKE 1 TABLET BY MOUTH DAILY, Disp: 90 tablet, Rfl: 3   FARXIGA  10 MG TABS tablet, TAKE 1 TABLET BY MOUTH DAILY, Disp: 90 tablet, Rfl: 3   FLUoxetine  (PROZAC ) 40 MG capsule, TAKE 1 CAPSULE BY MOUTH DAILY, Disp: 90 capsule, Rfl: 3   gabapentin  (NEURONTIN ) 300 MG capsule, TAKE 1 CAPSULE BY MOUTH THREE TIMES A DAY, Disp: 270 capsule, Rfl: 1   hydrOXYzine  (ATARAX ) 10 MG  tablet, Take 1 tablet (10 mg total) by mouth 3 (three) times daily as needed., Disp: 90 tablet, Rfl: 0   ibuprofen  (ADVIL ) 800 MG tablet, Take 1 tablet (800 mg total) by mouth every 8 (eight) hours as needed., Disp: 90 tablet, Rfl: 0   lamoTRIgine  (LAMICTAL ) 25 MG tablet, Take 3 tablets (75 mg total) by mouth daily., Disp: 270 tablet, Rfl: 0   loratadine (CLARITIN) 10 MG tablet, Take 20 mg by mouth daily., Disp: , Rfl:    montelukast  (SINGULAIR ) 10 MG tablet, TAKE 1 TABLET BY MOUTH EVERYDAY AT BEDTIME, Disp: 90 tablet, Rfl: 3   MOUNJARO  5 MG/0.5ML Pen, INJECT THE CONTENTS OF ONE PEN  SUBCUTANEOUSLY WEEKLY AS  DIRECTED, Disp: 6 mL, Rfl: 3   omeprazole (PRILOSEC) 40 MG capsule, , Disp: , Rfl:    propranolol  (INDERAL ) 10 MG tablet, Take 1 tablet (10 mg total) by mouth 2 (two) times daily., Disp: 180 tablet, Rfl: 0   saline (AYR) GEL, Place 1 application into both nostrils., Disp: , Rfl:    valsartan -hydrochlorothiazide  (DIOVAN -HCT) 320-25 MG  tablet, TAKE 1 TABLET BY MOUTH DAILY, Disp: 90 tablet, Rfl: 3  Observations/Objective: Patient is well-developed, well-nourished in no acute distress.  Resting comfortably in bed  Head is normocephalic, atraumatic.  No labored breathing.  Speech is clear and coherent with logical content.  Patient is alert and oriented at baseline.    Assessment and Plan: 1. Nausea (Primary) - ondansetron  (ZOFRAN -ODT) 4 MG disintegrating tablet; Take 1 tablet (4 mg total) by mouth every 8 (eight) hours as needed for nausea or vomiting.  Dispense: 20 tablet; Refill: 0  2. POTS (postural orthostatic tachycardia syndrome)  3. Type 2 diabetes mellitus with hyperglycemia, without long-term current use of insulin (HCC) - tirzepatide  (MOUNJARO ) 2.5 MG/0.5ML Pen; Inject 2.5 mg into the skin once a week.  Dispense: 2 mL; Refill: 0  Will decrease Mounjaro  to 2.5 mg from 5 mg Pt will make follow up with PCP to discus Small meals  Zofran  prn for nausea Force fluids Follow if  symptoms worsen or do not improve   Follow Up Instructions: I discussed the assessment and treatment plan with the patient. The patient was provided an opportunity to ask questions and all were answered. The patient agreed with the plan and demonstrated an understanding of the instructions.  A copy of instructions were sent to the patient via MyChart unless otherwise noted below.    The patient was advised to call back or seek an in-person evaluation if the symptoms worsen or if the condition fails to improve as anticipated.    Bari Learn, FNP

## 2023-12-20 NOTE — Telephone Encounter (Signed)
 CLONAZEPAM  Last OV: 10/26/23 Next OV: 01/31/24 Last RF: 09/03/23

## 2023-12-21 ENCOUNTER — Ambulatory Visit: Admitting: Professional

## 2023-12-24 ENCOUNTER — Other Ambulatory Visit: Payer: Self-pay | Admitting: Physician Assistant

## 2023-12-24 DIAGNOSIS — M5416 Radiculopathy, lumbar region: Secondary | ICD-10-CM

## 2023-12-27 ENCOUNTER — Telehealth: Admitting: Physician Assistant

## 2023-12-27 DIAGNOSIS — R3989 Other symptoms and signs involving the genitourinary system: Secondary | ICD-10-CM

## 2023-12-27 MED ORDER — NITROFURANTOIN MONOHYD MACRO 100 MG PO CAPS: 100.0000 mg | ORAL_CAPSULE | Freq: Two times a day (BID) | ORAL | 0 refills | Status: DC

## 2023-12-27 NOTE — Progress Notes (Signed)

## 2023-12-29 ENCOUNTER — Telehealth: Admitting: Physician Assistant

## 2023-12-29 DIAGNOSIS — B379 Candidiasis, unspecified: Secondary | ICD-10-CM

## 2023-12-29 MED ORDER — FLUCONAZOLE 150 MG PO TABS: 150.0000 mg | ORAL_TABLET | ORAL | 0 refills | Status: DC | PRN

## 2023-12-29 NOTE — Progress Notes (Signed)

## 2024-01-01 ENCOUNTER — Other Ambulatory Visit: Payer: Self-pay | Admitting: Physician Assistant

## 2024-01-01 DIAGNOSIS — G250 Essential tremor: Secondary | ICD-10-CM

## 2024-01-05 ENCOUNTER — Encounter: Payer: Self-pay | Admitting: Professional

## 2024-01-05 ENCOUNTER — Ambulatory Visit (INDEPENDENT_AMBULATORY_CARE_PROVIDER_SITE_OTHER): Admitting: Professional

## 2024-01-05 DIAGNOSIS — F411 Generalized anxiety disorder: Secondary | ICD-10-CM | POA: Diagnosis not present

## 2024-01-05 DIAGNOSIS — F331 Major depressive disorder, recurrent, moderate: Secondary | ICD-10-CM | POA: Diagnosis not present
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
## 2024-01-18 ENCOUNTER — Ambulatory Visit (INDEPENDENT_AMBULATORY_CARE_PROVIDER_SITE_OTHER): Admitting: Professional

## 2024-01-18 ENCOUNTER — Encounter: Payer: Self-pay | Admitting: Professional

## 2024-01-18 DIAGNOSIS — F411 Generalized anxiety disorder: Secondary | ICD-10-CM | POA: Diagnosis not present

## 2024-01-18 DIAGNOSIS — F331 Major depressive disorder, recurrent, moderate: Secondary | ICD-10-CM
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
## 2024-01-25 ENCOUNTER — Other Ambulatory Visit: Payer: Self-pay | Admitting: Medical Genetics

## 2024-01-26 ENCOUNTER — Other Ambulatory Visit: Payer: Self-pay | Admitting: Physician Assistant

## 2024-01-26 DIAGNOSIS — F331 Major depressive disorder, recurrent, moderate: Secondary | ICD-10-CM

## 2024-01-26 DIAGNOSIS — F4329 Adjustment disorder with other symptoms: Secondary | ICD-10-CM

## 2024-01-31 ENCOUNTER — Ambulatory Visit: Admitting: Physician Assistant

## 2024-01-31 DIAGNOSIS — E1165 Type 2 diabetes mellitus with hyperglycemia: Secondary | ICD-10-CM

## 2024-02-01 ENCOUNTER — Encounter: Payer: Self-pay | Admitting: Professional

## 2024-02-01 ENCOUNTER — Ambulatory Visit: Admitting: Professional

## 2024-02-01 DIAGNOSIS — F411 Generalized anxiety disorder: Secondary | ICD-10-CM | POA: Diagnosis not present

## 2024-02-01 DIAGNOSIS — F331 Major depressive disorder, recurrent, moderate: Secondary | ICD-10-CM
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
## 2024-02-08 ENCOUNTER — Encounter: Payer: Self-pay | Admitting: Physician Assistant

## 2024-02-08 ENCOUNTER — Ambulatory Visit (INDEPENDENT_AMBULATORY_CARE_PROVIDER_SITE_OTHER): Admitting: Physician Assistant

## 2024-02-08 VITALS — BP 139/66 | HR 79 | Ht 64.0 in | Wt 307.0 lb

## 2024-02-08 DIAGNOSIS — G250 Essential tremor: Secondary | ICD-10-CM | POA: Diagnosis not present

## 2024-02-08 DIAGNOSIS — R809 Proteinuria, unspecified: Secondary | ICD-10-CM

## 2024-02-08 DIAGNOSIS — I10 Essential (primary) hypertension: Secondary | ICD-10-CM

## 2024-02-08 DIAGNOSIS — F411 Generalized anxiety disorder: Secondary | ICD-10-CM

## 2024-02-08 DIAGNOSIS — E1129 Type 2 diabetes mellitus with other diabetic kidney complication: Secondary | ICD-10-CM

## 2024-02-08 DIAGNOSIS — E1165 Type 2 diabetes mellitus with hyperglycemia: Secondary | ICD-10-CM

## 2024-02-08 DIAGNOSIS — Z7984 Long term (current) use of oral hypoglycemic drugs: Secondary | ICD-10-CM

## 2024-02-08 LAB — POCT UA - MICROALBUMIN
Creatinine, POC: 50 mg/dL
Microalbumin Ur, POC: 10 mg/L

## 2024-02-08 LAB — POCT GLYCOSYLATED HEMOGLOBIN (HGB A1C): Hemoglobin A1C: 5.7 % — AB (ref 4.0–5.6)

## 2024-02-08 LAB — BASIC METABOLIC PANEL WITH GFR: Glucose: 104

## 2024-02-08 LAB — LIPID PANEL
Cholesterol: 152 (ref 0–200)
HDL: 44 (ref 35–70)
LDL Cholesterol: 81
LDl/HDL Ratio: 3.5
Triglycerides: 174 — AB (ref 40–160)

## 2024-02-08 LAB — HEMOGLOBIN A1C: Hemoglobin A1C: 5.9

## 2024-02-08 MED ORDER — DEXCOM G7 SENSOR MISC: 11 refills | Status: AC

## 2024-02-08 MED ORDER — FREESTYLE LIBRE 3 SENSOR MISC: 1.0000 | 3 refills | Status: AC

## 2024-02-08 MED ORDER — HYDROXYZINE PAMOATE 25 MG PO CAPS
ORAL_CAPSULE | ORAL | 0 refills | Status: AC
Start: 2024-02-08 — End: ?

## 2024-02-08 NOTE — Telephone Encounter (Signed)
 Printed and placed in bin to be abstracted

## 2024-02-08 NOTE — Telephone Encounter (Signed)
 Please send to abstract.

## 2024-02-08 NOTE — Assessment & Plan Note (Addendum)
 A1C looks good today at 5.7%! Micro UA is also normal.  Continue diabetic regimen Will check on continuous glucometer monitor, but continue checking blood glucose at home

## 2024-02-08 NOTE — Assessment & Plan Note (Signed)
 Discussed increased anxiety with some natural remedies to help with mood Increase Hydroxyzine  to 25 mg once a day at bedtime  Continue Lamictal  75 mg  Continue Wellbutrin  300 mg (150 mg 2x daily)  If symptoms/mood worsens, please contact the office

## 2024-02-08 NOTE — Progress Notes (Signed)
 Established Patient Office Visit  Subjective   Patient ID: Karen Stokes, female    DOB: March 06, 1986  Age: 38 y.o. MRN: [id number]  Chief Complaint  Patient presents with   Medical Management of Chronic Issues    Patient is a 38 yo female presenting for a follow up on diabetic and medication management. At her last appointment, pt was started on Propranolol  for tremors, Hydroxyzine  for anxiety/sleep aid, and increased her Lamictal  dosage for anxiety. She stated she as not started the Propranolol  due to concerns with taking too many medications. The hydroxyzine  has decreased effects on her sleep and anxiety at this time. No concerns noted with the Lamictal  at this time.   She reports compliance with the her diabetic regimen and would like to inquire about a continuous glucometer monitoring device. No adverse reactions to her diabetic regimen. She is not checking her sugars. She denies any hypoglycemic events.   she is concerned about her increased libido as well. No other concerns noted at this time.   Review of Systems  Constitutional:  Negative for fever.  Cardiovascular:  Negative for chest pain.  Gastrointestinal:  Negative for abdominal pain, constipation, diarrhea, nausea and vomiting.  Neurological:  Positive for tremors.  Psychiatric/Behavioral:  Negative for depression. The patient is nervous/anxious. The patient does not have insomnia.        Patient reports difficulty staying asleep even with use of sleep aid.   All other systems reviewed and are negative.     Objective:     BP 139/66   Pulse 79   Ht 5' 4 (1.626 m)   Wt (!) 307 lb (139.3 kg)   SpO2 99%   BMI 52.70 kg/m  BP Readings from Last 3 Encounters:  02/08/24 139/66  10/26/23 (!) 104/49  07/26/23 (!) 111/57   Wt Readings from Last 3 Encounters:  02/08/24 (!) 307 lb (139.3 kg)  10/26/23 298 lb (135.2 kg)  07/26/23 296 lb (134.3 kg)      Physical Exam Constitutional:      Appearance: Normal  appearance. She is obese.  HENT:     Head: Normocephalic and atraumatic.  Eyes:     Extraocular Movements: Extraocular movements intact.     Conjunctiva/sclera: Conjunctivae normal.  Cardiovascular:     Rate and Rhythm: Normal rate and regular rhythm.     Heart sounds: Normal heart sounds.  Pulmonary:     Effort: Pulmonary effort is normal.  Neurological:     General: No focal deficit present.     Mental Status: She is alert and oriented to person, place, and time. Mental status is at baseline.     Motor: Tremor present.     Comments: Minor essential tremor noted   Psychiatric:        Mood and Affect: Mood normal.        Behavior: Behavior normal.        Thought Content: Thought content normal.        Judgment: Judgment normal.      Results for orders placed or performed in visit on 02/08/24  POCT HgB A1C  Result Value Ref Range   Hemoglobin A1C 5.7 (A) 4.0 - 5.6 %   HbA1c POC (<> result, manual entry)     HbA1c, POC (prediabetic range)     HbA1c, POC (controlled diabetic range)    POCT UA - Microalbumin  Result Value Ref Range   Microalbumin Ur, POC 10 mg/L   Creatinine, POC 50 mg/dL  Albumin/Creatinine Ratio, Urine, POC 30-300       Assessment & Plan:  SABRASABRAYajahira was seen today for medical management of chronic issues.  Diagnoses and all orders for this visit:  Type 2 diabetes mellitus with hyperglycemia, without long-term current use of insulin (HCC) -     POCT HgB A1C -     POCT UA - Microalbumin -     Continuous Glucose Sensor (FREESTYLE LIBRE 3 SENSOR) MISC; 1 each by Does not apply route every 14 (fourteen) days. Please apply for 14 days and then switch to new sensor -     Continuous Glucose Sensor (DEXCOM G7 SENSOR) MISC; Apply sensor for 10 days.  Essential tremor  GAD (generalized anxiety disorder) -     hydrOXYzine  (VISTARIL ) 25 MG capsule; Take one tablet at bedtime for sleep.  Primary hypertension   A1C to goal today Stay on same medications BP  not to goal Microalbuminuria noted, on ARB and SGLT-2 On STATIN and Zetia  Libre ordered to see if she can get something to keep a better eye on sugars Needs eye exam Flu shot declined today Follow up in 3 months  Hydroxyzine  increased and refilled.   Follow up in 3 months    Return in about 3 months (around 05/10/2024).    Winnie Umali, PA-C

## 2024-02-08 NOTE — Assessment & Plan Note (Addendum)
 Start propranolol  for tremor  Let the office know of any adverse reactions

## 2024-02-08 NOTE — Assessment & Plan Note (Addendum)
 BP looks good today  Begin propranolol  10mg   Continue BP meds and daily monitoring at home.

## 2024-02-14 ENCOUNTER — Encounter: Payer: Self-pay | Admitting: Physician Assistant

## 2024-02-15 ENCOUNTER — Encounter: Payer: Self-pay | Admitting: Professional

## 2024-02-15 ENCOUNTER — Ambulatory Visit: Admitting: Professional

## 2024-02-15 DIAGNOSIS — F331 Major depressive disorder, recurrent, moderate: Secondary | ICD-10-CM

## 2024-02-15 DIAGNOSIS — F411 Generalized anxiety disorder: Secondary | ICD-10-CM | POA: Diagnosis not present
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
## 2024-02-29 ENCOUNTER — Encounter: Payer: Self-pay | Admitting: Professional

## 2024-02-29 ENCOUNTER — Ambulatory Visit: Admitting: Professional

## 2024-02-29 DIAGNOSIS — F411 Generalized anxiety disorder: Secondary | ICD-10-CM

## 2024-02-29 DIAGNOSIS — F331 Major depressive disorder, recurrent, moderate: Secondary | ICD-10-CM
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
## 2024-03-14 ENCOUNTER — Ambulatory Visit: Admitting: Professional

## 2024-03-14 ENCOUNTER — Encounter: Payer: Self-pay | Admitting: Professional

## 2024-03-14 DIAGNOSIS — F331 Major depressive disorder, recurrent, moderate: Secondary | ICD-10-CM

## 2024-03-14 DIAGNOSIS — F411 Generalized anxiety disorder: Secondary | ICD-10-CM

## 2024-03-14 NOTE — Addendum Note (Signed)
 Addended by: GEROME ROLLO CROME on: 03/14/2024 10:03 AM   Modules accepted: Level of Service
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
## 2024-03-15 ENCOUNTER — Other Ambulatory Visit: Payer: Self-pay | Admitting: Physician Assistant

## 2024-03-15 DIAGNOSIS — J302 Other seasonal allergic rhinitis: Secondary | ICD-10-CM

## 2024-03-15 DIAGNOSIS — Z9109 Other allergy status, other than to drugs and biological substances: Secondary | ICD-10-CM

## 2024-03-26 ENCOUNTER — Other Ambulatory Visit: Payer: Self-pay | Admitting: Physician Assistant

## 2024-03-26 DIAGNOSIS — F439 Reaction to severe stress, unspecified: Secondary | ICD-10-CM

## 2024-03-26 DIAGNOSIS — F411 Generalized anxiety disorder: Secondary | ICD-10-CM

## 2024-03-28 ENCOUNTER — Ambulatory Visit: Admitting: Professional

## 2024-03-28 ENCOUNTER — Encounter: Payer: Self-pay | Admitting: Professional

## 2024-03-28 DIAGNOSIS — F331 Major depressive disorder, recurrent, moderate: Secondary | ICD-10-CM | POA: Diagnosis not present

## 2024-03-28 DIAGNOSIS — F411 Generalized anxiety disorder: Secondary | ICD-10-CM | POA: Diagnosis not present
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
## 2024-03-28 NOTE — Telephone Encounter (Signed)
 CLONAZEPAM  Last OV: 02/08/24 Next OV: 05/10/24 Last RF: 12/20/23

## 2024-04-06 ENCOUNTER — Other Ambulatory Visit: Payer: Self-pay | Admitting: Physician Assistant

## 2024-04-25 ENCOUNTER — Ambulatory Visit: Admitting: Professional

## 2024-05-09 ENCOUNTER — Ambulatory Visit: Admitting: Professional

## 2024-05-10 ENCOUNTER — Ambulatory Visit: Admitting: Physician Assistant

## 2024-05-10 DIAGNOSIS — I10 Essential (primary) hypertension: Secondary | ICD-10-CM

## 2024-05-10 DIAGNOSIS — F411 Generalized anxiety disorder: Secondary | ICD-10-CM

## 2024-05-10 DIAGNOSIS — R809 Proteinuria, unspecified: Secondary | ICD-10-CM

## 2024-05-12 ENCOUNTER — Other Ambulatory Visit: Payer: Self-pay | Admitting: Physician Assistant

## 2024-05-23 ENCOUNTER — Ambulatory Visit: Admitting: Physician Assistant

## 2024-06-06 ENCOUNTER — Ambulatory Visit: Admitting: Professional

## 2024-06-20 ENCOUNTER — Ambulatory Visit: Admitting: Professional

## 2024-07-04 ENCOUNTER — Ambulatory Visit: Admitting: Professional
